# Patient Record
Sex: Female | Born: 1960 | Race: White | Hispanic: No | Smoking: Current every day smoker
Health system: Southern US, Community
[De-identification: ages and names within clinical notes are randomized; demographics above are authoritative.]

## PROBLEM LIST (undated history)

## (undated) DIAGNOSIS — F419 Anxiety disorder, unspecified: Secondary | ICD-10-CM

## (undated) DIAGNOSIS — R945 Abnormal results of liver function studies: Secondary | ICD-10-CM

## (undated) DIAGNOSIS — R7989 Other specified abnormal findings of blood chemistry: Secondary | ICD-10-CM

## (undated) DIAGNOSIS — G4700 Insomnia, unspecified: Secondary | ICD-10-CM

## (undated) DIAGNOSIS — I10 Essential (primary) hypertension: Secondary | ICD-10-CM

## (undated) DIAGNOSIS — E785 Hyperlipidemia, unspecified: Secondary | ICD-10-CM

## (undated) DIAGNOSIS — E039 Hypothyroidism, unspecified: Secondary | ICD-10-CM

## (undated) DIAGNOSIS — R0683 Snoring: Principal | ICD-10-CM

## (undated) DIAGNOSIS — F17200 Nicotine dependence, unspecified, uncomplicated: Secondary | ICD-10-CM

## (undated) HISTORY — DX: Snoring: R06.83

## (undated) HISTORY — DX: Hyperlipidemia, unspecified: E78.5

## (undated) HISTORY — DX: Abnormal results of liver function studies: R94.5

## (undated) HISTORY — DX: Other specified abnormal findings of blood chemistry: R79.89

## (undated) HISTORY — DX: Anxiety disorder, unspecified: F41.9

## (undated) HISTORY — DX: Insomnia, unspecified: G47.00

## (undated) HISTORY — DX: Hypothyroidism, unspecified: E03.9

## (undated) HISTORY — DX: Essential (primary) hypertension: I10

## (undated) HISTORY — DX: Nicotine dependence, unspecified, uncomplicated: F17.200

---

## 1998-12-09 HISTORY — PX: ABDOMINAL HYSTERECTOMY: SHX81

## 2004-01-12 ENCOUNTER — Other Ambulatory Visit: Payer: Self-pay

## 2005-08-05 ENCOUNTER — Ambulatory Visit: Payer: Self-pay | Admitting: Unknown Physician Specialty

## 2006-05-16 ENCOUNTER — Ambulatory Visit: Payer: Self-pay | Admitting: Family Medicine

## 2008-12-09 HISTORY — PX: COLONOSCOPY W/ POLYPECTOMY: SHX1380

## 2009-01-19 ENCOUNTER — Ambulatory Visit: Payer: Self-pay | Admitting: Family Medicine

## 2009-02-08 ENCOUNTER — Ambulatory Visit: Payer: Self-pay | Admitting: Family Medicine

## 2009-08-16 ENCOUNTER — Ambulatory Visit: Payer: Self-pay | Admitting: Family Medicine

## 2009-08-23 ENCOUNTER — Ambulatory Visit: Payer: Self-pay | Admitting: Family Medicine

## 2010-04-12 ENCOUNTER — Ambulatory Visit: Payer: Self-pay | Admitting: Gastroenterology

## 2010-10-22 ENCOUNTER — Ambulatory Visit: Payer: Self-pay | Admitting: Unknown Physician Specialty

## 2010-10-24 ENCOUNTER — Ambulatory Visit: Payer: Self-pay | Admitting: Unknown Physician Specialty

## 2012-09-16 ENCOUNTER — Other Ambulatory Visit: Payer: Self-pay | Admitting: *Deleted

## 2012-09-16 MED ORDER — LEVOTHYROXINE SODIUM 100 MCG PO TABS: 100.0000 ug | ORAL_TABLET | Freq: Every day | ORAL | Status: DC

## 2012-09-22 ENCOUNTER — Ambulatory Visit (INDEPENDENT_AMBULATORY_CARE_PROVIDER_SITE_OTHER): Payer: 59 | Admitting: Family Medicine

## 2012-09-22 ENCOUNTER — Telehealth: Payer: Self-pay

## 2012-09-22 ENCOUNTER — Ambulatory Visit: Payer: 59

## 2012-09-22 ENCOUNTER — Encounter: Payer: Self-pay | Admitting: Family Medicine

## 2012-09-22 ENCOUNTER — Other Ambulatory Visit: Payer: Self-pay | Admitting: Family Medicine

## 2012-09-22 VITALS — BP 140/106 | HR 98 | Temp 98.0°F | Resp 16 | Ht 64.5 in | Wt 247.8 lb

## 2012-09-22 DIAGNOSIS — M545 Low back pain, unspecified: Secondary | ICD-10-CM

## 2012-09-22 DIAGNOSIS — E039 Hypothyroidism, unspecified: Secondary | ICD-10-CM

## 2012-09-22 DIAGNOSIS — R319 Hematuria, unspecified: Secondary | ICD-10-CM

## 2012-09-22 DIAGNOSIS — E7800 Pure hypercholesterolemia, unspecified: Secondary | ICD-10-CM

## 2012-09-22 MED ORDER — MELOXICAM 15 MG PO TABS: 15.0000 mg | ORAL_TABLET | Freq: Every day | ORAL | Status: DC

## 2012-09-22 MED ORDER — LOVASTATIN 20 MG PO TABS: 20.0000 mg | ORAL_TABLET | Freq: Every day | ORAL | Status: DC

## 2012-09-22 NOTE — Telephone Encounter (Signed)
Dr. Smith please advise

## 2012-09-22 NOTE — Patient Instructions (Addendum)
1. Unspecified hypothyroidism  TSH [[facility]], T4, free [[facility]]  2. Pure hypercholesterolemia  CBC [[facility]], Comprehensive metabolic panel [[facility]], lovastatin (MEVACOR) 20 MG tablet  3. Low back pain  DG Lumbar Spine Complete, meloxicam (MOBIC) 15 MG tablet   Low Back Sprain with Rehab  A sprain is an injury in which a ligament is torn. The ligaments of the lower back are vulnerable to sprains. However, they are strong and require great force to be injured. These ligaments are important for stabilizing the spinal column. Sprains are classified into three categories. Grade 1 sprains cause pain, but the tendon is not lengthened. Grade 2 sprains include a lengthened ligament, due to the ligament being stretched or partially ruptured. With grade 2 sprains there is still function, although the function may be decreased. Grade 3 sprains involve a complete tear of the tendon or muscle, and function is usually impaired. SYMPTOMS   Severe pain in the lower back.  Sometimes, a feeling of a "pop," "snap," or tear, at the time of injury.  Tenderness and sometimes swelling at the injury site.  Uncommonly, bruising (contusion) within 48 hours of injury.  Muscle spasms in the back. CAUSES  Low back sprains occur when a force is placed on the ligaments that is greater than they can handle. Common causes of injury include:  Performing a stressful act while off-balance.  Repetitive stressful activities that involve movement of the lower back.  Direct hit (trauma) to the lower back. RISK INCREASES WITH:  Contact sports (football, wrestling).  Collisions (major skiing accidents).  Sports that require throwing or lifting (baseball, weightlifting).  Sports involving twisting of the spine (gymnastics, diving, tennis, golf).  Poor strength and flexibility.  Inadequate protection.  Previous back injury or surgery (especially fusion). PREVENTION  Wear properly fitted and padded protective  equipment.  Warm up and stretch properly before activity.  Allow for adequate recovery between workouts.  Maintain physical fitness:  Strength, flexibility, and endurance.  Cardiovascular fitness.  Maintain a healthy body weight. PROGNOSIS  If treated properly, low back sprains usually heal with non-surgical treatment. The length of time for healing depends on the severity of the injury.  RELATED COMPLICATIONS   Recurring symptoms, resulting in a chronic problem.  Chronic inflammation and pain in the low back.  Delayed healing or resolution of symptoms, especially if activity is resumed too soon.  Prolonged impairment.  Unstable or arthritic joints of the low back. TREATMENT  Treatment first involves the use of ice and medicine, to reduce pain and inflammation. The use of strengthening and stretching exercises may help reduce pain with activity. These exercises may be performed at home or with a therapist. Severe injuries may require referral to a therapist for further evaluation and treatment, such as ultrasound. Your caregiver may advise that you wear a back brace or corset, to help reduce pain and discomfort. Often, prolonged bed rest results in greater harm then benefit. Corticosteroid injections may be recommended. However, these should be reserved for the most serious cases. It is important to avoid using your back when lifting objects. At night, sleep on your back on a firm mattress, with a pillow placed under your knees. If non-surgical treatment is unsuccessful, surgery may be needed.  MEDICATION   If pain medicine is needed, nonsteroidal anti-inflammatory medicines (aspirin and ibuprofen), or other minor pain relievers (acetaminophen), are often advised.  Do not take pain medicine for 7 days before surgery.  Prescription pain relievers may be given, if your caregiver thinks  they are needed. Use only as directed and only as much as you need.  Ointments applied to the skin  may be helpful.  Corticosteroid injections may be given by your caregiver. These injections should be reserved for the most serious cases, because they may only be given a certain number of times. HEAT AND COLD  Cold treatment (icing) should be applied for 10 to 15 minutes every 2 to 3 hours for inflammation and pain, and immediately after activity that aggravates your symptoms. Use ice packs or an ice massage.  Heat treatment may be used before performing stretching and strengthening activities prescribed by your caregiver, physical therapist, or athletic trainer. Use a heat pack or a warm water soak. SEEK MEDICAL CARE IF:   Symptoms get worse or do not improve in 2 to 4 weeks, despite treatment.  You develop numbness or weakness in either leg.  You lose bowel or bladder function.  Any of the following occur after surgery: fever, increased pain, swelling, redness, drainage of fluids, or bleeding in the affected area.  New, unexplained symptoms develop. (Drugs used in treatment may produce side effects.) EXERCISES  RANGE OF MOTION (ROM) AND STRETCHING EXERCISES - Low Back Sprain Most people with lower back pain will find that their symptoms get worse with excessive bending forward (flexion) or arching at the lower back (extension). The exercises that will help resolve your symptoms will focus on the opposite motion.  Your physician, physical therapist or athletic trainer will help you determine which exercises will be most helpful to resolve your lower back pain. Do not complete any exercises without first consulting with your caregiver. Discontinue any exercises which make your symptoms worse, until you speak to your caregiver. If you have pain, numbness or tingling which travels down into your buttocks, leg or foot, the goal of the therapy is for these symptoms to move closer to your back and eventually resolve. Sometimes, these leg symptoms will get better, but your lower back pain may  worsen. This is often an indication of progress in your rehabilitation. Be very alert to any changes in your symptoms and the activities in which you participated in the 24 hours prior to the change. Sharing this information with your caregiver will allow him or her to most efficiently treat your condition. These exercises may help you when beginning to rehabilitate your injury. Your symptoms may resolve with or without further involvement from your physician, physical therapist or athletic trainer. While completing these exercises, remember:   Restoring tissue flexibility helps normal motion to return to the joints. This allows healthier, less painful movement and activity.  An effective stretch should be held for at least 30 seconds.  A stretch should never be painful. You should only feel a gentle lengthening or release in the stretched tissue. FLEXION RANGE OF MOTION AND STRETCHING EXERCISES: STRETCH  Flexion, Single Knee to Chest   Lie on a firm bed or floor with both legs extended in front of you.  Keeping one leg in contact with the floor, bring your opposite knee to your chest. Hold your leg in place by either grabbing behind your thigh or at your knee.  Pull until you feel a gentle stretch in your low back. Hold __________ seconds.  Slowly release your grasp and repeat the exercise with the opposite side. Repeat __________ times. Complete this exercise __________ times per day.  STRETCH  Flexion, Double Knee to Chest  Lie on a firm bed or floor with both legs  extended in front of you.  Keeping one leg in contact with the floor, bring your opposite knee to your chest.  Tense your stomach muscles to support your back and then lift your other knee to your chest. Hold your legs in place by either grabbing behind your thighs or at your knees.  Pull both knees toward your chest until you feel a gentle stretch in your low back. Hold __________ seconds.  Tense your stomach muscles and  slowly return one leg at a time to the floor. Repeat __________ times. Complete this exercise __________ times per day.  STRETCH  Low Trunk Rotation  Lie on a firm bed or floor. Keeping your legs in front of you, bend your knees so they are both pointed toward the ceiling and your feet are flat on the floor.  Extend your arms out to the side. This will stabilize your upper body by keeping your shoulders in contact with the floor.  Gently and slowly drop both knees together to one side until you feel a gentle stretch in your low back. Hold for __________ seconds.  Tense your stomach muscles to support your lower back as you bring your knees back to the starting position. Repeat the exercise to the other side. Repeat __________ times. Complete this exercise __________ times per day  EXTENSION RANGE OF MOTION AND FLEXIBILITY EXERCISES: STRETCH  Extension, Prone on Elbows   Lie on your stomach on the floor, a bed will be too soft. Place your palms about shoulder width apart and at the height of your head.  Place your elbows under your shoulders. If this is too painful, stack pillows under your chest.  Allow your body to relax so that your hips drop lower and make contact more completely with the floor.  Hold this position for __________ seconds.  Slowly return to lying flat on the floor. Repeat __________ times. Complete this exercise __________ times per day.  RANGE OF MOTION  Extension, Prone Press Ups  Lie on your stomach on the floor, a bed will be too soft. Place your palms about shoulder width apart and at the height of your head.  Keeping your back as relaxed as possible, slowly straighten your elbows while keeping your hips on the floor. You may adjust the placement of your hands to maximize your comfort. As you gain motion, your hands will come more underneath your shoulders.  Hold this position __________ seconds.  Slowly return to lying flat on the floor. Repeat __________  times. Complete this exercise __________ times per day.  RANGE OF MOTION- Quadruped, Neutral Spine   Assume a hands and knees position on a firm surface. Keep your hands under your shoulders and your knees under your hips. You may place padding under your knees for comfort.  Drop your head and point your tailbone toward the ground below you. This will round out your lower back like an angry cat. Hold this position for __________ seconds.  Slowly lift your head and release your tail bone so that your back sags into a large arch, like an old horse.  Hold this position for __________ seconds.  Repeat this until you feel limber in your low back.  Now, find your "sweet spot." This will be the most comfortable position somewhere between the two previous positions. This is your neutral spine. Once you have found this position, tense your stomach muscles to support your low back.  Hold this position for __________ seconds. Repeat __________ times. Complete this  exercise __________ times per day.  STRENGTHENING EXERCISES - Low Back Sprain These exercises may help you when beginning to rehabilitate your injury. These exercises should be done near your "sweet spot." This is the neutral, low-back arch, somewhere between fully rounded and fully arched, that is your least painful position. When performed in this safe range of motion, these exercises can be used for people who have either a flexion or extension based injury. These exercises may resolve your symptoms with or without further involvement from your physician, physical therapist or athletic trainer. While completing these exercises, remember:   Muscles can gain both the endurance and the strength needed for everyday activities through controlled exercises.  Complete these exercises as instructed by your physician, physical therapist or athletic trainer. Increase the resistance and repetitions only as guided.  You may experience muscle soreness  or fatigue, but the pain or discomfort you are trying to eliminate should never worsen during these exercises. If this pain does worsen, stop and make certain you are following the directions exactly. If the pain is still present after adjustments, discontinue the exercise until you can discuss the trouble with your caregiver. STRENGTHENING Deep Abdominals, Pelvic Tilt   Lie on a firm bed or floor. Keeping your legs in front of you, bend your knees so they are both pointed toward the ceiling and your feet are flat on the floor.  Tense your lower abdominal muscles to press your low back into the floor. This motion will rotate your pelvis so that your tail bone is scooping upwards rather than pointing at your feet or into the floor. With a gentle tension and even breathing, hold this position for __________ seconds. Repeat __________ times. Complete this exercise __________ times per day.  STRENGTHENING  Abdominals, Crunches   Lie on a firm bed or floor. Keeping your legs in front of you, bend your knees so they are both pointed toward the ceiling and your feet are flat on the floor. Cross your arms over your chest.  Slightly tip your chin down without bending your neck.  Tense your abdominals and slowly lift your trunk high enough to just clear your shoulder blades. Lifting higher can put excessive stress on the lower back and does not further strengthen your abdominal muscles.  Control your return to the starting position. Repeat __________ times. Complete this exercise __________ times per day.  STRENGTHENING  Quadruped, Opposite UE/LE Lift   Assume a hands and knees position on a firm surface. Keep your hands under your shoulders and your knees under your hips. You may place padding under your knees for comfort.  Find your neutral spine and gently tense your abdominal muscles so that you can maintain this position. Your shoulders and hips should form a rectangle that is parallel with the  floor and is not twisted.  Keeping your trunk steady, lift your right hand no higher than your shoulder and then your left leg no higher than your hip. Make sure you are not holding your breath. Hold this position for __________ seconds.  Continuing to keep your abdominal muscles tense and your back steady, slowly return to your starting position. Repeat with the opposite arm and leg. Repeat __________ times. Complete this exercise __________ times per day.  STRENGTHENING  Abdominals and Quadriceps, Straight Leg Raise   Lie on a firm bed or floor with both legs extended in front of you.  Keeping one leg in contact with the floor, bend the other knee so that  your foot can rest flat on the floor.  Find your neutral spine, and tense your abdominal muscles to maintain your spinal position throughout the exercise.  Slowly lift your straight leg off the floor about 6 inches for a count of 15, making sure to not hold your breath.  Still keeping your neutral spine, slowly lower your leg all the way to the floor. Repeat this exercise with each leg __________ times. Complete this exercise __________ times per day. POSTURE AND BODY MECHANICS CONSIDERATIONS - Low Back Sprain Keeping correct posture when sitting, standing or completing your activities will reduce the stress put on different body tissues, allowing injured tissues a chance to heal and limiting painful experiences. The following are general guidelines for improved posture. Your physician or physical therapist will provide you with any instructions specific to your needs. While reading these guidelines, remember:  The exercises prescribed by your provider will help you have the flexibility and strength to maintain correct postures.  The correct posture provides the best environment for your joints to work. All of your joints have less wear and tear when properly supported by a spine with good posture. This means you will experience a  healthier, less painful body.  Correct posture must be practiced with all of your activities, especially prolonged sitting and standing. Correct posture is as important when doing repetitive low-stress activities (typing) as it is when doing a single heavy-load activity (lifting). RESTING POSITIONS Consider which positions are most painful for you when choosing a resting position. If you have pain with flexion-based activities (sitting, bending, stooping, squatting), choose a position that allows you to rest in a less flexed posture. You would want to avoid curling into a fetal position on your side. If your pain worsens with extension-based activities (prolonged standing, working overhead), avoid resting in an extended position such as sleeping on your stomach. Most people will find more comfort when they rest with their spine in a more neutral position, neither too rounded nor too arched. Lying on a non-sagging bed on your side with a pillow between your knees, or on your back with a pillow under your knees will often provide some relief. Keep in mind, being in any one position for a prolonged period of time, no matter how correct your posture, can still lead to stiffness. PROPER SITTING POSTURE In order to minimize stress and discomfort on your spine, you must sit with correct posture. Sitting with good posture should be effortless for a healthy body. Returning to good posture is a gradual process. Many people can work toward this most comfortably by using various supports until they have the flexibility and strength to maintain this posture on their own. When sitting with proper posture, your ears will fall over your shoulders and your shoulders will fall over your hips. You should use the back of the chair to support your upper back. Your lower back will be in a neutral position, just slightly arched. You may place a small pillow or folded towel at the base of your lower back for  support.  When  working at a desk, create an environment that supports good, upright posture. Without extra support, muscles tire, which leads to excessive strain on joints and other tissues. Keep these recommendations in mind: CHAIR:  A chair should be able to slide under your desk when your back makes contact with the back of the chair. This allows you to work closely.  The chair's height should allow your eyes to be  level with the upper part of your monitor and your hands to be slightly lower than your elbows. BODY POSITION  Your feet should make contact with the floor. If this is not possible, use a foot rest.  Keep your ears over your shoulders. This will reduce stress on your neck and low back. INCORRECT SITTING POSTURES  If you are feeling tired and unable to assume a healthy sitting posture, do not slouch or slump. This puts excessive strain on your back tissues, causing more damage and pain. Healthier options include:  Using more support, like a lumbar pillow.  Switching tasks to something that requires you to be upright or walking.  Talking a brief walk.  Lying down to rest in a neutral-spine position. PROLONGED STANDING WHILE SLIGHTLY LEANING FORWARD  When completing a task that requires you to lean forward while standing in one place for a long time, place either foot up on a stationary 2-4 inch high object to help maintain the best posture. When both feet are on the ground, the lower back tends to lose its slight inward curve. If this curve flattens (or becomes too large), then the back and your other joints will experience too much stress, tire more quickly, and can cause pain. CORRECT STANDING POSTURES Proper standing posture should be assumed with all daily activities, even if they only take a few moments, like when brushing your teeth. As in sitting, your ears should fall over your shoulders and your shoulders should fall over your hips. You should keep a slight tension in your abdominal  muscles to brace your spine. Your tailbone should point down to the ground, not behind your body, resulting in an over-extended swayback posture.  INCORRECT STANDING POSTURES  Common incorrect standing postures include a forward head, locked knees and/or an excessive swayback. WALKING Walk with an upright posture. Your ears, shoulders and hips should all line-up. PROLONGED ACTIVITY IN A FLEXED POSITION When completing a task that requires you to bend forward at your waist or lean over a low surface, try to find a way to stabilize 3 out of 4 of your limbs. You can place a hand or elbow on your thigh or rest a knee on the surface you are reaching across. This will provide you more stability, so that your muscles do not tire as quickly. By keeping your knees relaxed, or slightly bent, you will also reduce stress across your lower back. CORRECT LIFTING TECHNIQUES DO :  Assume a wide stance. This will provide you more stability and the opportunity to get as close as possible to the object which you are lifting.  Tense your abdominals to brace your spine. Bend at the knees and hips. Keeping your back locked in a neutral-spine position, lift using your leg muscles. Lift with your legs, keeping your back straight.  Test the weight of unknown objects before attempting to lift them.  Try to keep your elbows locked down at your sides in order get the best strength from your shoulders when carrying an object.  Always ask for help when lifting heavy or awkward objects. INCORRECT LIFTING TECHNIQUES DO NOT:   Lock your knees when lifting, even if it is a small object.  Bend and twist. Pivot at your feet or move your feet when needing to change directions.  Assume that you can safely pick up even a paperclip without proper posture. Document Released: 11/25/2005 Document Revised: 02/17/2012 Document Reviewed: 03/09/2009 [facility] Patient Information 2013 [place], [place].

## 2012-09-22 NOTE — Progress Notes (Signed)
[address]   [place], [place]  [address]   [phone]  Subjective:    Patient ID: Beth Hughes, female    DOB: [birth year], 51 y.o.   MRN: [id number]  HPIThis 50 y.o. female presents to establish care and for six month follow-up:  1.  Fatigue: tired a lot.  Onset two weeks ago but intermittent since killing thyroid.  S/p thyroid ablation.  Chronic stress at work, home, family.  No over the top stress; work is crazy.  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    2.  Hypercholesterolemia:  Fasting.  Out of medication in one week.  Due for labs.  Report moderate compliance with medication; good tolerance to medication; good symptom control. Denies HA/dizziness/focal weakness/paresthesias.  3.  R hip pain: onset several months ago.  Sitting too long causes lower R hip.  Bought new bed.  No radiation into leg.  +Lower back pain.  +numbness in toes with prolonged sitting.  No weakness in leg.  No b/b function.  No saddle paresthesias.  Taking nothing really.  Severity 2-8/10.  By end of day, miserable.    4. Flu shot: getting at work.   Review of Systems  Constitutional: Positive for fatigue. Negative for fever, chills, diaphoresis, activity change, appetite change and unexpected weight change.  Eyes: Negative for photophobia and visual disturbance.  Respiratory: Negative for cough, shortness of breath and wheezing.   Cardiovascular: Negative for chest pain, palpitations and leg swelling.  Musculoskeletal: Positive for myalgias, back pain, arthralgias and gait problem. Negative for joint swelling.  Neurological: Positive for numbness. Negative for dizziness, tremors, syncope, facial asymmetry, speech difficulty, weakness, light-headedness and headaches.  Psychiatric/Behavioral: Positive for dysphoric mood. Negative for suicidal ideas and self-injury. The patient is nervous/anxious.     Past Medical History  Diagnosis Date  . Unspecified hypothyroidism   . Other and unspecified hyperlipidemia   .  Insomnia, unspecified   . Tobacco use disorder   . Anxiety   . Abnormal LFTs (liver function tests)     No past surgical history on file.  Prior to Admission medications   Medication Sig Start Date End Date Taking? Authorizing Provider  ALPRAZolam (XANAX) 0.25 MG tablet Take 1 tablet (0.25 mg total) by mouth at bedtime as needed. 09/23/12  Yes Ethelda Chick, MD  cyclobenzaprine (FLEXERIL) 10 MG tablet Take 1 tablet (10 mg total) by mouth 3 (three) times daily as needed. 09/23/12  Yes Ethelda Chick, MD  levothyroxine (SYNTHROID, LEVOTHROID) 100 MCG tablet Take 1 tablet (100 mcg total) by mouth daily. 09/16/12  Yes Heather M Marte, PA-C  lovastatin (MEVACOR) 20 MG tablet Take 1 tablet (20 mg total) by mouth at bedtime. 09/22/12  Yes Ethelda Chick, MD  meloxicam (MOBIC) 15 MG tablet Take 1 tablet (15 mg total) by mouth daily. 09/22/12   Ethelda Chick, MD    Allergies  Allergen Reactions  . Penicillins     Blacked out  . Pseudoephedrine     Heart races, seizures    History   Social History  . Marital Status: Unknown    Spouse Name: N/A    Number of Children: N/A  . Years of Education: N/A   Occupational History  . Not on file.   Social History Main Topics  . Smoking status: Current Every Day Smoker  . Smokeless tobacco: Not on file  . Alcohol Use: No  . Drug Use: No  . Sexually Active: Not on file   Other  Topics Concern  . Not on file   Social History Narrative  . No narrative on file    No family history on file.      Objective:   Physical Exam  Nursing note and vitals reviewed. Constitutional: She is oriented to person, place, and time. She appears well-developed and well-nourished. No distress.  HENT:  Mouth/Throat: Oropharynx is clear and moist.  Eyes: Conjunctivae normal and EOM are normal. Pupils are equal, round, and reactive to light.  Neck: Normal range of motion. Neck supple. No JVD present. No thyromegaly present.  Cardiovascular: Normal rate,  regular rhythm, normal heart sounds and intact distal pulses.  Exam reveals no gallop and no friction rub.   No murmur heard. Pulmonary/Chest: Effort normal and breath sounds normal. She has no wheezes. She has no rales.  Abdominal: Soft. Bowel sounds are normal. There is no tenderness. There is no rebound and no guarding.  Musculoskeletal:       Right hip: She exhibits normal range of motion, normal strength and no tenderness.       Left hip: She exhibits normal range of motion, normal strength and no tenderness.       Lumbar back: She exhibits decreased range of motion, tenderness, pain and spasm. She exhibits no bony tenderness.       LUMBAR:  +TTP PARASPINAL REGIONS; STRAIGHT LEG RAISES NEGATIVE; MOTOR 5/5 BLE; TOE AND HEEL WALKING INTACT.  MARCHING INTACT.   B HIPS: FULL ROM B HIPS; NON-TENDER ALONG PELVIS; NORMAL INTERNAL AND EXTERNAL ROTATION; MOTOR 5/5 ALL DIRECTIONS OF HIPS.  Lymphadenopathy:    She has no cervical adenopathy.  Neurological: She is alert and oriented to person, place, and time. She has normal reflexes. No cranial nerve deficit. She exhibits normal muscle tone. Coordination normal.  Skin: No rash noted. She is not diaphoretic.  Psychiatric: She has a normal mood and affect. Her behavior is normal. Judgment and thought content normal.    [facility] reading (PRIMARY) by  Dr. Katrinka Blazing.  LS spine: NAD.  Results for orders placed in visit on 09/22/12  URINE CULTURE      Component Value Range   Colony Count NO GROWTH     Organism ID, Bacteria NO GROWTH         Assessment & Plan:   1. Unspecified hypothyroidism  TSH [[facility]], T4, free [[facility]]  2. Pure hypercholesterolemia  CBC [[facility]], Comprehensive metabolic panel [[facility]], lovastatin (MEVACOR) 20 MG tablet  3. Low back pain  DG Lumbar Spine Complete, meloxicam (MOBIC) 15 MG tablet  4. Hematuria  Urine culture     1.  Hypothyroidism:  Controlled; obtain labs; continue current medication. 2.  Hypercholesterolemia:   Moderately controlled; obtain labs; refill of Lovastatin provided. 3.  Low back pain:  New.  Rx for Mobic 15mg  daily; start Flexeril tid PRN but scheduled qhs.  Home exercises recommended to perform daily.  If no improvement in 4-6 weeks, to call for ortho referral. 4. Hematuria:  New. Send urine culture; if negative, will warrant repeat u/a at next visit.  If blood remains, will warrant urology referral.  Meds ordered this encounter  Medications  . DISCONTD: ALPRAZolam (XANAX) 0.25 MG tablet    Sig: Take 0.25 mg by mouth at bedtime as needed.  Marland Kitchen DISCONTD: cyclobenzaprine (FLEXERIL) 10 MG tablet    Sig: Take 10 mg by mouth 3 (three) times daily as needed.  Marland Kitchen DISCONTD: lovastatin (MEVACOR) 20 MG tablet    Sig: Take 20 mg by mouth at  bedtime.  . meloxicam (MOBIC) 15 MG tablet    Sig: Take 1 tablet (15 mg total) by mouth daily.    Dispense:  30 tablet    Refill:  0  . lovastatin (MEVACOR) 20 MG tablet    Sig: Take 1 tablet (20 mg total) by mouth at bedtime.    Dispense:  30 tablet    Refill:  11  . cyclobenzaprine (FLEXERIL) 10 MG tablet    Sig: Take 1 tablet (10 mg total) by mouth 3 (three) times daily as needed.    Dispense:  60 tablet    Refill:  3  . ALPRAZolam (XANAX) 0.25 MG tablet    Sig: Take 1 tablet (0.25 mg total) by mouth at bedtime as needed.    Dispense:  30 tablet    Refill:  3

## 2012-09-22 NOTE — Telephone Encounter (Signed)
Pt states that she saw dr Katrinka Blazing this morning and that she was supposed to call in a muscle relaxer and xanax but the pharmacy did not get those   Best [phone]

## 2012-09-23 MED ORDER — ALPRAZOLAM 0.25 MG PO TABS: 0.2500 mg | ORAL_TABLET | Freq: Every evening | ORAL | Status: DC | PRN

## 2012-09-23 MED ORDER — CYCLOBENZAPRINE HCL 10 MG PO TABS
10.0000 mg | ORAL_TABLET | Freq: Three times a day (TID) | ORAL | Status: DC | PRN
Start: 2012-09-23 — End: 2013-04-20

## 2012-09-23 NOTE — Telephone Encounter (Signed)
I sent in Flexeril/muscle relaxer to pharmacy.  Please call in Xanax 0.25mg  one po qhs PRN #[phone] refills. KMS

## 2012-09-23 NOTE — Telephone Encounter (Signed)
Called patient to advise. This is done for her, called in the Xanax, others sent by Dr Katrinka Blazing.

## 2012-09-24 LAB — URINE CULTURE: Colony Count: NO GROWTH

## 2012-09-30 ENCOUNTER — Telehealth: Payer: Self-pay

## 2012-09-30 LAB — COMPREHENSIVE METABOLIC PANEL
ALT: 22 IU/L (ref 0–32)
AST: 20 IU/L (ref 0–40)
Albumin/Globulin Ratio: 1.4 (ref 1.1–2.5)
Alkaline Phosphatase: 78 IU/L (ref 42–107)
BUN/Creatinine Ratio: 17 (ref 9–23)
Chloride: 103 mmol/L (ref 97–108)
GFR calc Af Amer: 104 mL/min/{1.73_m2} (ref >59)
Glucose: 83 mg/dL (ref 65–99)
Potassium: 4.1 mmol/L (ref 3.5–5.2)
Sodium: 136 mmol/L (ref 134–144)
Total Bilirubin: 0.2 mg/dL (ref 0.0–1.2)

## 2012-09-30 LAB — CBC WITH DIFFERENTIAL
Basophils Absolute: 0 10*3/uL (ref 0.0–0.2)
Eosinophils Absolute: 0.1 10*3/uL (ref 0.0–0.4)
HCT: 47.6 % — ABNORMAL HIGH (ref 34.0–46.6)
Lymphs: 38 % (ref 14–46)
MCH: 29.9 pg (ref 26.6–33.0)
MCHC: 33.6 g/dL (ref 31.5–35.7)
Monocytes Absolute: 0.3 10*3/uL (ref 0.1–0.9)
Neutrophils Absolute: 3.3 10*3/uL (ref 1.4–7.0)
RDW: 13.5 % (ref 12.3–15.4)

## 2012-09-30 LAB — TSH+FREE T4
Free T4: 1.36 ng/dL (ref 0.82–1.77)
TSH: 4.11 u[IU]/mL (ref 0.450–4.500)

## 2012-09-30 LAB — SPECIMEN STATUS REPORT

## 2012-09-30 NOTE — Telephone Encounter (Signed)
Patient was called today concerning xray results.  Patient inquired about labs.  Called [facility] for results and spoke with Lequita Halt, she will fax results.  Results given to Dr. Nilda Simmer for review.

## 2012-10-01 NOTE — Telephone Encounter (Signed)
Lab results received from [employer] on 10/23; pt notified of results on 10/01/12.  Apologized in delay of advising of results. KMS

## 2012-10-05 ENCOUNTER — Encounter: Payer: Self-pay | Admitting: *Deleted

## 2012-10-05 ENCOUNTER — Encounter: Payer: Self-pay | Admitting: Family Medicine

## 2012-12-04 NOTE — Progress Notes (Signed)
Reviewed and agree.

## 2012-12-22 ENCOUNTER — Encounter: Payer: Self-pay | Admitting: *Deleted

## 2012-12-22 ENCOUNTER — Telehealth: Payer: Self-pay | Admitting: *Deleted

## 2012-12-22 NOTE — Telephone Encounter (Signed)
Per Dr. Katrinka Blazing let pt know that cholesterol under excellent control and thyroid function is normal at 2.500.  Lm with woman to cb

## 2012-12-24 MED ORDER — LEVOTHYROXINE SODIUM 100 MCG PO TABS: ORAL_TABLET | ORAL | Status: DC

## 2012-12-24 MED ORDER — LEVOTHYROXINE SODIUM 100 MCG PO TABS: 100.0000 ug | ORAL_TABLET | Freq: Every day | ORAL | Status: DC

## 2012-12-24 NOTE — Telephone Encounter (Signed)
Gave pt lab results and advised pt I am sending her in a new Rx for her levothyrozine. Pt wanted to make sure that we send in enough for the new dose she had been taking for the month previous to test which was 1 tab QD except 1 1/2 tabs on Tues and Thurs. Checked w/Dr Katrinka Blazing and she agreed that we need to send in the new Rx w/new sig and enough tablets to last for the month at new dose. Sent in Rx.

## 2013-01-12 ENCOUNTER — Telehealth: Payer: Self-pay

## 2013-01-12 NOTE — Telephone Encounter (Signed)
Pt is requesting to speak with dr Katrinka Blazing regarding services on 09/22/12. She states she is getting a bill from [place] for a urine culture and states she did not give a urine specimen and there was not anything sent out for a culture.

## 2013-01-12 NOTE — Telephone Encounter (Signed)
Dr. Katrinka Blazing, I see you did a urine culture on pt for hematuria, but pt doesn't recall peeing in a cup and we're not sure what to tell pt. Please advise.

## 2013-01-12 NOTE — Telephone Encounter (Signed)
Spoke to patient she states she did not provide urine specimen. Please advise.

## 2013-01-18 NOTE — Telephone Encounter (Signed)
After reviewing chart, it does look like the urine culture charge was an error on [facility].  Please apologize to patient for error.  We will correct [facility] bill.

## 2013-01-18 NOTE — Telephone Encounter (Signed)
Pt notified that we will doctor bill the lab

## 2013-02-22 ENCOUNTER — Telehealth: Payer: Self-pay | Admitting: Radiology

## 2013-02-22 NOTE — Telephone Encounter (Signed)
Different brand of Synthroid will be disensed to patient per [facility]. To you FYI

## 2013-02-22 NOTE — Telephone Encounter (Signed)
FYI only Dr. Katrinka Blazing

## 2013-03-08 ENCOUNTER — Telehealth: Payer: Self-pay

## 2013-03-08 MED ORDER — ALPRAZOLAM 0.25 MG PO TABS: 0.2500 mg | ORAL_TABLET | Freq: Every evening | ORAL | Status: DC | PRN
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
## 2013-03-08 NOTE — Telephone Encounter (Signed)
PATIENT STATES SHE IS GOING THRU A "BAD TIME" AND WOULD LIKE TO BE PRESCRIBED SOMETHING TO HELP COPE. TOLD PATIENT SHE PROBABLY NEEDS TO COME IN FOR AN OFFICE VISIT.   CALL BACK: [phone]

## 2013-03-08 NOTE — Telephone Encounter (Signed)
I am happy to refill Alprazolam since it is a refill and not a new rx.  She will be due for six month follow-up in 03/2013; thus, needs to schedule follow-up if does not have one.  If Alprazolam is not helping with stress, needs to let me know.

## 2013-03-08 NOTE — Telephone Encounter (Signed)
Patient advised rx called in for her to [facility]

## 2013-04-20 ENCOUNTER — Encounter: Payer: Self-pay | Admitting: Family Medicine

## 2013-04-20 ENCOUNTER — Ambulatory Visit (INDEPENDENT_AMBULATORY_CARE_PROVIDER_SITE_OTHER): Payer: 59 | Admitting: Family Medicine

## 2013-04-20 VITALS — BP 142/96 | HR 95 | Temp 98.0°F | Resp 16 | Ht 64.5 in | Wt 249.0 lb

## 2013-04-20 DIAGNOSIS — E7800 Pure hypercholesterolemia, unspecified: Secondary | ICD-10-CM

## 2013-04-20 DIAGNOSIS — M5030 Other cervical disc degeneration, unspecified cervical region: Secondary | ICD-10-CM

## 2013-04-20 DIAGNOSIS — S39012D Strain of muscle, fascia and tendon of lower back, subsequent encounter: Secondary | ICD-10-CM

## 2013-04-20 DIAGNOSIS — E039 Hypothyroidism, unspecified: Secondary | ICD-10-CM

## 2013-04-20 DIAGNOSIS — F411 Generalized anxiety disorder: Secondary | ICD-10-CM

## 2013-04-20 DIAGNOSIS — Z1239 Encounter for other screening for malignant neoplasm of breast: Secondary | ICD-10-CM

## 2013-04-20 MED ORDER — ALPRAZOLAM 0.25 MG PO TABS: 0.2500 mg | ORAL_TABLET | Freq: Every evening | ORAL | Status: DC | PRN

## 2013-04-20 MED ORDER — LEVOTHYROXINE SODIUM 100 MCG PO TABS: ORAL_TABLET | ORAL | Status: DC

## 2013-04-20 MED ORDER — CYCLOBENZAPRINE HCL 10 MG PO TABS: 10.0000 mg | ORAL_TABLET | Freq: Three times a day (TID) | ORAL | Status: DC | PRN
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
## 2013-05-20 ENCOUNTER — Other Ambulatory Visit: Payer: Self-pay | Admitting: *Deleted

## 2013-05-20 ENCOUNTER — Telehealth: Payer: Self-pay | Admitting: *Deleted

## 2013-05-20 DIAGNOSIS — Z1231 Encounter for screening mammogram for malignant neoplasm of breast: Secondary | ICD-10-CM

## 2013-05-20 DIAGNOSIS — E7800 Pure hypercholesterolemia, unspecified: Secondary | ICD-10-CM

## 2013-05-20 NOTE — Telephone Encounter (Signed)
Pt called and wanted to know her lab results.  Results are in your box in Dr lounge.

## 2013-05-21 MED ORDER — LOVASTATIN 40 MG PO TABS: 40.0000 mg | ORAL_TABLET | Freq: Every day | ORAL | Status: DC

## 2013-05-21 NOTE — Telephone Encounter (Signed)
Please return her call; here are her lab results:  1.  No evidence of anemia.  2.  Blood sugar normal at 80; no evidence of diabetes.  3. Liver and kidney functions normal.  4.  Thyroid function normal on current dose of medication.  5.  Vitamin B12 and folate levels normal.  6.  Vitamin D level low; recommend increasing daily Vitamin D supplementation by an additional 1000 IU; can purchase over the counter.  7.  Cholesterol above goal; recommend increasing Lovastatin to 40mg  daily; I have sent in new prescription to pharmacy.

## 2013-05-21 NOTE — Telephone Encounter (Signed)
Gave pt results and instr's. Pt agreed.

## 2013-05-25 ENCOUNTER — Ambulatory Visit: Payer: Self-pay | Admitting: Family Medicine

## 2013-06-02 ENCOUNTER — Encounter: Payer: Self-pay | Admitting: Radiology

## 2013-06-02 ENCOUNTER — Telehealth: Payer: Self-pay | Admitting: Radiology

## 2013-06-02 NOTE — Telephone Encounter (Signed)
Left voice mail mammogram normal

## 2013-06-03 ENCOUNTER — Encounter: Payer: Self-pay | Admitting: Family Medicine

## 2013-06-04 ENCOUNTER — Other Ambulatory Visit: Payer: Self-pay

## 2013-06-04 MED ORDER — LOVASTATIN 40 MG PO TABS: 40.0000 mg | ORAL_TABLET | Freq: Every day | ORAL | Status: DC

## 2013-07-29 ENCOUNTER — Other Ambulatory Visit: Payer: Self-pay

## 2013-07-29 MED ORDER — LOVASTATIN 40 MG PO TABS: 40.0000 mg | ORAL_TABLET | Freq: Every day | ORAL | Status: DC

## 2013-08-16 ENCOUNTER — Ambulatory Visit (INDEPENDENT_AMBULATORY_CARE_PROVIDER_SITE_OTHER): Payer: 59 | Admitting: Family Medicine

## 2013-08-16 ENCOUNTER — Encounter: Payer: Self-pay | Admitting: Family Medicine

## 2013-08-16 VITALS — BP 140/93 | HR 100 | Temp 98.2°F | Resp 18 | Ht 66.0 in | Wt 255.0 lb

## 2013-08-16 DIAGNOSIS — E890 Postprocedural hypothyroidism: Secondary | ICD-10-CM | POA: Insufficient documentation

## 2013-08-16 DIAGNOSIS — E7800 Pure hypercholesterolemia, unspecified: Secondary | ICD-10-CM | POA: Insufficient documentation

## 2013-08-16 DIAGNOSIS — F411 Generalized anxiety disorder: Secondary | ICD-10-CM | POA: Insufficient documentation

## 2013-08-16 DIAGNOSIS — Z01419 Encounter for gynecological examination (general) (routine) without abnormal findings: Secondary | ICD-10-CM | POA: Insufficient documentation

## 2013-08-16 DIAGNOSIS — Z0000 Encounter for general adult medical examination without abnormal findings: Secondary | ICD-10-CM

## 2013-08-16 DIAGNOSIS — F17200 Nicotine dependence, unspecified, uncomplicated: Secondary | ICD-10-CM

## 2013-08-16 DIAGNOSIS — Z720 Tobacco use: Secondary | ICD-10-CM | POA: Insufficient documentation

## 2013-08-16 DIAGNOSIS — R05 Cough: Secondary | ICD-10-CM | POA: Insufficient documentation

## 2013-08-16 DIAGNOSIS — R059 Cough, unspecified: Secondary | ICD-10-CM

## 2013-08-16 LAB — POCT UA - MICROSCOPIC ONLY
Crystals, Ur, HPF, POC: NEGATIVE
Mucus, UA: NEGATIVE
WBC, Ur, HPF, POC: NEGATIVE
Yeast, UA: NEGATIVE

## 2013-08-16 LAB — POCT URINALYSIS DIPSTICK
Bilirubin, UA: NEGATIVE
Ketones, UA: NEGATIVE
Leukocytes, UA: NEGATIVE
Spec Grav, UA: 1.01

## 2013-08-16 LAB — PULMONARY FUNCTION TEST

## 2013-08-16 MED ORDER — LEVOTHYROXINE SODIUM 100 MCG PO TABS: ORAL_TABLET | ORAL | Status: DC

## 2013-08-16 MED ORDER — ALPRAZOLAM 0.25 MG PO TABS: 0.2500 mg | ORAL_TABLET | Freq: Every evening | ORAL | Status: DC | PRN

## 2013-08-16 MED ORDER — CYCLOBENZAPRINE HCL 10 MG PO TABS: 10.0000 mg | ORAL_TABLET | Freq: Three times a day (TID) | ORAL | Status: DC | PRN

## 2013-08-16 NOTE — Progress Notes (Signed)
[address]   [place], [place]  [address]   [id number]  Subjective:    Patient ID: Beth Hughes, female    DOB: [birth year], 52 y.o.   MRN: [id number]  HPI This 52 y.o. female presents for evaluation for CPE.  Last physical 2012 Dr. Barnabas Lister. Pap smear 2012 Dr. Barnabas Lister. Mammogram 06/02/2013 normal. Colonoscopy 12/09/2008 Positive for polyps, needs repeat 2015.  Iftikhar. TDAP 04/23/2011. Pneumovax 04/23/2011. Flu vaccine gets annually at work. Eye exam- wears prescription glasses for working on computer. Sees [facility], last exam 2 years ago. 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  Mother passed away 5 weeks ago unexpectedly following CABG after 8 years of having bad health (CVA, brain anuryesm, DM) . Doesn't know history of father. Patient teary. Having good and bad days. Sleeping ok with xanax. Her sister had chemo, lumpectomy and is currently having radiation. Sister 11 with angina, edema. Brothers 2 deceased- liver cancer age 48, ETOH/asphixiation age 65. Living brother with CHF, COPD, DM age 35. Lives with son.  Quit smoking for over a month, started back when her mother died. Will try to stop in the future. No ETOH, drugs.  No regular exercise.  Will be having increased stress at work due to changing CPT codes.  Missed about 10 days of mevacor until able to get insurance straightened out.  BP was 128/80 at work recently. Has a  BP cuff at home.   Surgical history- had total hysterectomy 14 years ago due to ovarian cysts and bleeding.  Review of Systems  Constitutional: Negative.   HENT: Negative for neck pain and neck stiffness.   Eyes: Negative.   Respiratory: Positive for cough. Negative for shortness of breath, wheezing and stridor.   Cardiovascular: Negative for chest pain, palpitations and leg swelling.  Gastrointestinal: Negative.   Endocrine: Negative.   Genitourinary: Negative.   Musculoskeletal: Positive for back pain.  Skin: Negative.    Neurological: Negative.   Psychiatric/Behavioral: Positive for dysphoric mood. Negative for sleep disturbance. The patient is nervous/anxious.    Continued back and neck pain. Neck stiff. No shoulder pain. Pain with pressure on right shoulder. No numbness or tingling in arms or legs. Hearing ok. No headaches, no double vision. Some blurred vision at distance. No mouth sores. No chest pain, no palpitations. No shortness of breath. Occasional cough. Snores. Feels good in the mornings.  BM every day. No diarrhea. No heartburn. No stomach pain. No new night sweats. Stays hot all the time since hysterectomy 14 years ago. Nocturia x 2. Incontinence with coughing. Doesn't wear sunscreen except face. Never been to derm. No FH skin cancer.   Past Medical History  Diagnosis Date  . Unspecified hypothyroidism   . Other and unspecified hyperlipidemia   . Insomnia, unspecified   . Tobacco use disorder   . Anxiety   . Abnormal LFTs (liver function tests)     Past Surgical History  Procedure Laterality Date  . Abdominal hysterectomy  12/09/1998    ovarian cysts; B ooophorectomy  . Colonoscopy w/ polypectomy  12/09/2008    colon polyps. Iftikhar.  Repeat in 5 years.    Prior to Admission medications   Medication Sig Start Date End Date Taking? Authorizing Provider  ALPRAZolam (XANAX) 0.25 MG tablet Take 1 tablet (0.25 mg total) by mouth at bedtime as needed for anxiety. 08/16/13  Yes Ethelda Chick, MD  cyclobenzaprine (FLEXERIL) 10 MG tablet Take 1 tablet (10 mg total) by mouth 3 (three) times  daily as needed. 08/16/13  Yes Ethelda Chick, MD  levothyroxine (SYNTHROID, LEVOTHROID) 100 MCG tablet Take one tablet by mouth daily on every day, except on Tuesdays and Thursdays take 1 1/2 tablets. 08/16/13  Yes Ethelda Chick, MD  lovastatin (MEVACOR) 40 MG tablet Take 1 tablet (40 mg total) by mouth at bedtime. 07/29/13  Yes Ethelda Chick, MD  meloxicam (MOBIC) 15 MG tablet Take 1 tablet (15 mg total) by  mouth daily. 09/22/12  Yes Ethelda Chick, MD  zolpidem (AMBIEN) 10 MG tablet Take 10 mg by mouth at bedtime as needed for sleep.   Yes Historical Provider, MD    Allergies  Allergen Reactions  . Penicillins     Blacked out  . Pseudoephedrine     Heart races, seizures    History   Social History  . Marital Status: Unknown    Spouse Name: N/A    Number of Children: N/A  . Years of Education: N/A   Occupational History  . [employer] Production designer, theatre/television/film    Social History Main Topics  . Smoking status: Current Every Day Smoker -- 1.00 packs/day for 30 years  . Smokeless tobacco: Never Used  . Alcohol Use: No  . Drug Use: No  . Sexual Activity: Not Currently     Comment: Last sexual activity 2004.   Other Topics Concern  . Not on file   Social History Narrative   Marital status: single; not dating.  Not interested.      Children; one son (56); no grandchildren      Lives: with son.        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      Tobacco: 1 ppd x 35 years.      Alcohol: none      Drugs: none      Exercise: none    Family History  Problem Relation Age of Onset  . Congestive Heart Failure Mother   . Diabetes Mother   . COPD Mother   . Atrial fibrillation Mother   . Macular degeneration Mother   . Heart disease Mother 91    CABG  . Cancer Sister 38    breast cancer  . Alcohol abuse Brother   . COPD Brother   . Atrial fibrillation Brother        Objective:   Physical Exam  Nursing note and vitals reviewed. Constitutional: She is oriented to person, place, and time. She appears well-developed and well-nourished. No distress.  HENT:  Head: Normocephalic and atraumatic.  Right Ear: External ear normal.  Left Ear: External ear normal.  Nose: Nose normal.  Mouth/Throat: Oropharynx is clear and moist.  Eyes: Conjunctivae and EOM are normal. Pupils are equal, round, and reactive to light.  Neck: Normal range of motion. Neck supple. No thyromegaly present.    Cardiovascular: Normal rate, regular rhythm, normal heart sounds and intact distal pulses.  Exam reveals no gallop and no friction rub.   No murmur heard. Pulmonary/Chest: Effort normal and breath sounds normal. She has no wheezes. She has no rales.  Abdominal: Soft. Bowel sounds are normal. She exhibits no distension and no mass. There is no tenderness. There is no rebound and no guarding.  Genitourinary: Vagina normal. No breast swelling, tenderness, discharge or bleeding. Pelvic exam was performed with patient supine. There is no rash, tenderness or lesion on the right labia. There is no rash, tenderness or lesion on the left labia. No erythema around the vagina.  No signs of injury around the vagina.  Musculoskeletal: Normal range of motion.  Lymphadenopathy:    She has no cervical adenopathy.  Neurological: She is alert and oriented to person, place, and time. She has normal reflexes.  Skin: Skin is warm and dry. No rash noted. She is not diaphoretic. No erythema. No pallor.  Diffuse sun related changes throughout extremities and upper torso.  Psychiatric: She has a normal mood and affect. Her behavior is normal. Judgment and thought content normal.  Tearful.   Results for orders placed in visit on 08/16/13  POCT URINALYSIS DIPSTICK      Result Value Range   Color, UA yellow     Clarity, UA clear     Glucose, UA neg     Bilirubin, UA neg     Ketones, UA neg     Spec Grav, UA 1.010     Blood, UA small     pH, UA 6.0     Protein, UA neg     Urobilinogen, UA 0.2     Nitrite, UA neg     Leukocytes, UA Negative    POCT UA - MICROSCOPIC ONLY      Result Value Range   WBC, Ur, HPF, POC neg     RBC, urine, microscopic 0-4     Bacteria, U Microscopic neg     Mucus, UA neg     Epithelial cells, urine per micros 0-9     Crystals, Ur, HPF, POC neg     Casts, Ur, LPF, POC neg     Yeast, UA neg     EKG: NSR.  SPIROMETRY:  FVC 54%, FEV1  44%, FEV1/FVC%  81%  --- SEVERE OBSTRUCTION.     Assessment & Plan:  Routine general medical examination at a health care facility - Plan: POCT urinalysis dipstick, CBC with Differential, CK, Comprehensive metabolic panel, Hemoglobin A1c, Lipid panel, TSH, Vit D  25 hydroxy (rtn osteoporosis monitoring), Vitamin B12, EKG 12-Lead, POCT UA - Microscopic Only, Pulmonary function test  Routine gynecological examination - Plan: Pap IG (Image Guided)  Cough  Tobacco abuse  Pure hypercholesterolemia  Anxiety state, unspecified  Hypothyroidism, postradioiodine therapy  Meds ordered this encounter  Medications  . zolpidem (AMBIEN) 10 MG tablet    Sig: Take 10 mg by mouth at bedtime as needed for sleep.  Marland Kitchen ALPRAZolam (XANAX) 0.25 MG tablet    Sig: Take 1 tablet (0.25 mg total) by mouth at bedtime as needed for anxiety.    Dispense:  30 tablet    Refill:  3  . cyclobenzaprine (FLEXERIL) 10 MG tablet    Sig: Take 1 tablet (10 mg total) by mouth 3 (three) times daily as needed.    Dispense:  60 tablet    Refill:  5  . levothyroxine (SYNTHROID, LEVOTHROID) 100 MCG tablet    Sig: Take one tablet by mouth daily on every day, except on Tuesdays and Thursdays take 1 1/2 tablets.    Dispense:  34 tablet    Refill:  5

## 2013-08-16 NOTE — Assessment & Plan Note (Signed)
New. Intermittent chronic issue; obtain spirometry; smoking cessation encouraged.

## 2013-08-16 NOTE — Assessment & Plan Note (Signed)
Pap smear obtained; mammogram UTD.

## 2013-08-16 NOTE — Patient Instructions (Signed)
1.  CHECK BLOOD PRESSURE ONCE WEEKLY AND KEEP DIARY/LOG; CALL IF BLOOD PRESSURE FREQUENTLY > 140/90. 2.  RECOMMEND STOPPING SMOKING. 3.  RECOMMEND WEIGHT LOSS AND EXERCISE.

## 2013-08-16 NOTE — Assessment & Plan Note (Signed)
Uncontrolled; tolerating Lovastatin 40mg  daily; obtain labs.

## 2013-08-16 NOTE — Assessment & Plan Note (Signed)
Anticipatory guidance --- weight loss, exercise, smoking cessation. Pap smear obtained; mammogram and colonoscopy UTD.  Immunizations UTD. Obtain labs.

## 2013-08-16 NOTE — Assessment & Plan Note (Signed)
Stable; obtain labs; continue current medication.

## 2013-08-16 NOTE — Assessment & Plan Note (Signed)
Contemplative.  Obtain spirometry. Cessation encouraged. Severe obstruction on spirometry; repeat at next visit.

## 2013-08-16 NOTE — Assessment & Plan Note (Signed)
Stable despite recent death of mother.  Counseling provided. Coping well with death of mother.  Refill of Xanax provided.

## 2013-08-17 LAB — COMPREHENSIVE METABOLIC PANEL
Albumin: 4.7 g/dL (ref 3.5–5.5)
BUN: 15 mg/dL (ref 6–24)
Chloride: 104 mmol/L (ref 97–108)
Creatinine, Ser: 0.79 mg/dL (ref 0.57–1.00)
GFR calc Af Amer: 99 mL/min/{1.73_m2} (ref >59)
Globulin, Total: 2.6 g/dL (ref 1.5–4.5)
Glucose: 95 mg/dL (ref 65–99)
Total Bilirubin: 0.3 mg/dL (ref 0.0–1.2)
Total Protein: 7.3 g/dL (ref 6.0–8.5)

## 2013-08-17 LAB — CBC WITH DIFFERENTIAL/PLATELET
Basos: 0 % (ref 0–3)
Eos: 1 % (ref 0–5)
HCT: 46.2 % (ref 34.0–46.6)
Hemoglobin: 15.3 g/dL (ref 11.1–15.9)
Immature Granulocytes: 0 % (ref 0–2)
Lymphocytes Absolute: 1.9 10*3/uL (ref 0.7–3.1)
MCV: 91 fL (ref 79–97)
Monocytes Absolute: 0.3 10*3/uL (ref 0.1–0.9)
Monocytes: 6 % (ref 4–12)
Neutrophils Absolute: 2.4 10*3/uL (ref 1.4–7.0)
RBC: 5.09 x10E6/uL (ref 3.77–5.28)
RDW: 13.7 % (ref 12.3–15.4)
WBC: 4.7 10*3/uL (ref 3.4–10.8)

## 2013-08-17 LAB — LIPID PANEL
Chol/HDL Ratio: 4.5 ratio units — ABNORMAL HIGH (ref 0.0–4.4)
HDL: 37 mg/dL — ABNORMAL LOW (ref >39)
Triglycerides: 134 mg/dL (ref 0–149)
VLDL Cholesterol Cal: 27 mg/dL (ref 5–40)

## 2013-08-17 LAB — HEMOGLOBIN A1C
Est. average glucose Bld gHb Est-mCnc: 120 mg/dL
Hgb A1c MFr Bld: 5.8 % — ABNORMAL HIGH (ref 4.8–5.6)

## 2013-08-19 LAB — PAP IG (IMAGE GUIDED)

## 2014-01-13 ENCOUNTER — Telehealth: Payer: Self-pay | Admitting: Family Medicine

## 2014-01-13 NOTE — Telephone Encounter (Signed)
She needs:  CBC, CMET, TSH, free T4, FLP, HgbA1c  Dx:  272.0, 244.9 for [employer].

## 2014-01-13 NOTE — Telephone Encounter (Signed)
Patient called stating she need a script to get her blood work done at [facility]  Patient can be contacted at [phone]

## 2014-01-13 NOTE — Telephone Encounter (Signed)
Please advise what labs needed and I will complete requisition

## 2014-01-17 ENCOUNTER — Other Ambulatory Visit: Payer: Self-pay | Admitting: Family Medicine

## 2014-01-17 NOTE — Telephone Encounter (Signed)
Please call in refill for Alprazolam.

## 2014-01-18 ENCOUNTER — Other Ambulatory Visit: Payer: Self-pay | Admitting: *Deleted

## 2014-01-18 ENCOUNTER — Other Ambulatory Visit: Payer: Self-pay | Admitting: Family Medicine

## 2014-01-18 DIAGNOSIS — E7800 Pure hypercholesterolemia, unspecified: Secondary | ICD-10-CM

## 2014-01-18 DIAGNOSIS — E039 Hypothyroidism, unspecified: Secondary | ICD-10-CM

## 2014-01-18 NOTE — Telephone Encounter (Signed)
Labs orderd. req's printed and mailed to pt. Pt notified.

## 2014-01-18 NOTE — Telephone Encounter (Signed)
Called in.

## 2014-01-19 NOTE — Telephone Encounter (Signed)
I approved this rx for refill on 01/17/14 to be called into pharmacy.  Have we called it in?

## 2014-02-14 ENCOUNTER — Ambulatory Visit (INDEPENDENT_AMBULATORY_CARE_PROVIDER_SITE_OTHER): Payer: 59 | Admitting: Family Medicine

## 2014-02-14 ENCOUNTER — Encounter: Payer: Self-pay | Admitting: Family Medicine

## 2014-02-14 VITALS — BP 159/100 | HR 97 | Temp 98.5°F | Resp 18 | Ht 64.75 in | Wt 255.2 lb

## 2014-02-14 DIAGNOSIS — I10 Essential (primary) hypertension: Secondary | ICD-10-CM | POA: Insufficient documentation

## 2014-02-14 DIAGNOSIS — F410 Panic disorder [episodic paroxysmal anxiety] without agoraphobia: Secondary | ICD-10-CM

## 2014-02-14 DIAGNOSIS — Z720 Tobacco use: Secondary | ICD-10-CM

## 2014-02-14 DIAGNOSIS — R7309 Other abnormal glucose: Secondary | ICD-10-CM

## 2014-02-14 DIAGNOSIS — R7302 Impaired glucose tolerance (oral): Secondary | ICD-10-CM

## 2014-02-14 DIAGNOSIS — I6529 Occlusion and stenosis of unspecified carotid artery: Secondary | ICD-10-CM | POA: Insufficient documentation

## 2014-02-14 DIAGNOSIS — R079 Chest pain, unspecified: Secondary | ICD-10-CM

## 2014-02-14 DIAGNOSIS — F17200 Nicotine dependence, unspecified, uncomplicated: Secondary | ICD-10-CM

## 2014-02-14 DIAGNOSIS — E669 Obesity, unspecified: Secondary | ICD-10-CM | POA: Insufficient documentation

## 2014-02-14 DIAGNOSIS — E7800 Pure hypercholesterolemia, unspecified: Secondary | ICD-10-CM

## 2014-02-14 DIAGNOSIS — F411 Generalized anxiety disorder: Secondary | ICD-10-CM

## 2014-02-14 DIAGNOSIS — E890 Postprocedural hypothyroidism: Secondary | ICD-10-CM

## 2014-02-14 MED ORDER — LEVOTHYROXINE SODIUM 100 MCG PO TABS: ORAL_TABLET | ORAL | Status: DC

## 2014-02-14 MED ORDER — LOVASTATIN 40 MG PO TABS: 40.0000 mg | ORAL_TABLET | Freq: Every day | ORAL | Status: DC

## 2014-02-14 MED ORDER — HYDROCHLOROTHIAZIDE 12.5 MG PO TABS: 12.5000 mg | ORAL_TABLET | Freq: Every day | ORAL | Status: DC

## 2014-02-14 NOTE — Progress Notes (Addendum)
This chart was scribed for Beth Honour, MD by Einar Pheasant, ED Scribe. This patient was seen in room 21 and the patient's care was started at 8:19 AM. Subjective:    Patient ID: Beth Hughes, female    DOB: [birth year], 53 y.o.   MRN: [id number]  02/14/2014  6 month check up, Hypothyroidism, Hyperlipidemia and Anxiety   HPI HPI Comments: Beth Hughes is a 53 y.o. female who presents to the [facility] for a 6 month check up. Pt is here for hypothyroidism, hyperlipidemia, and anxiety. Here last CPE was 08/16/13. All labs came back negative; pap smear was normal; spirometry showed severe obstruction.  Pt states that 2 weeks ago she started having an anxiety attack at work. She says that it felt like her chest was "thick", SOB, and palpitations.  She said these symptoms happened over the course of the week not just one day, and they only occured at work while at rest. She states that work has been very stressful, especially with trying to keep the other workers motivated. Pt states that she was sitting at her desk, at first she thought that it was her thyroid but it was not; TSH returned normal recently. Pt states that she had 3-4 episodes each lasting about 30-40 minutes. She says that she tried to alleviate her symptoms by deep breathing. Pt also reports associated nausea. Denies taking any Xanax for her attacks. She denies any associated chest pain. Pt has not had any more anxiety attacks since then.  Mrs. Bellows states that her mood has been stable. With occasional crying, especially when she remembers her Mother. Pt reports taking her prescribed Xanax every night, but none during the day. She says that she also drinks 2 cups of coffee and 2 diet Dr. Malachi Bonds daily. Pt states that she doesn't eat out much, but if she does she eats alfredo pasta. Advised pt to stay away from foods rich in carbohydrates and sugar.  She is still smoking. Pt states that quitting has been very hard  for her, but she hopes to quit soon. Spirometry at CPE in 08/2013 showed severe obstruction; she is smoking 1 ppd.    Low back pain has not changed. She says "not worse, not better but its still there". She is also complaining of chronic  neck stiffness. Pt states that when she stretches out her hands in front of her she can feel her fingers tingling. Denies any numbness or bilateral arm pain.  R hand fingers tingle when riding motorcycle.    Pt states that she doesn't know why her BP is elevated. She has not been experiencing any adverse symptoms. Advised her that her BP has been elevated every time she comes into the office for a check up. Told her that I am concerned about it so advised pt that a combination of medication and weight loss can aid in lowering her BP. Pt agreed with the proposed course of treatment.   Review of Systems  Constitutional: Negative for fever, chills, diaphoresis and fatigue.  Eyes: Negative for photophobia and visual disturbance.  Respiratory: Positive for shortness of breath. Negative for cough, wheezing and stridor.   Cardiovascular: Positive for palpitations. Negative for chest pain and leg swelling.  Gastrointestinal: Positive for nausea. Negative for vomiting, abdominal pain and diarrhea.  Musculoskeletal: Positive for back pain, myalgias, neck pain and neck stiffness.  Neurological: Positive for numbness. Negative for dizziness, syncope, speech difficulty, weakness, light-headedness and headaches.  Psychiatric/Behavioral: Positive for  sleep disturbance. Negative for suicidal ideas, self-injury, dysphoric mood and decreased concentration. The patient is nervous/anxious.     Past Medical History  Diagnosis Date  . Unspecified hypothyroidism   . Other and unspecified hyperlipidemia   . Insomnia, unspecified   . Tobacco use disorder   . Anxiety   . Abnormal LFTs (liver function tests)    Allergies  Allergen Reactions  . Penicillins     Blacked out  .  Pseudoephedrine     Heart races, seizures   Current Outpatient Prescriptions  Medication Sig Dispense Refill  . ALPRAZolam (XANAX) 0.25 MG tablet TAKE ONE TABLET BY MOUTH AT BEDTIME AS NEEDED FOR ANXIETY  30 tablet  2  . cyclobenzaprine (FLEXERIL) 10 MG tablet Take 1 tablet (10 mg total) by mouth 3 (three) times daily as needed.  60 tablet  5  . levothyroxine (SYNTHROID, LEVOTHROID) 100 MCG tablet Take one tablet by mouth daily on every day, except on Tuesdays and Thursdays take 1 1/2 tablets.  105 tablet  3  . lovastatin (MEVACOR) 40 MG tablet Take 1 tablet (40 mg total) by mouth at bedtime.  90 tablet  3  . zolpidem (AMBIEN) 10 MG tablet Take 10 mg by mouth at bedtime as needed for sleep.      . hydrochlorothiazide (HYDRODIURIL) 12.5 MG tablet Take 1 tablet (12.5 mg total) by mouth daily.  90 tablet  3  . meloxicam (MOBIC) 15 MG tablet Take 1 tablet (15 mg total) by mouth daily.  30 tablet  0   No current facility-administered medications for this visit.   History   Social History  . Marital Status: Unknown    Spouse Name: N/A    Number of Children: N/A  . Years of Education: N/A   Occupational History  . [employer] Freight forwarder    Social History Main Topics  . Smoking status: Current Every Day Smoker -- 1.00 packs/day for 30 years  . Smokeless tobacco: Never Used  . Alcohol Use: No  . Drug Use: No  . Sexual Activity: Not Currently     Comment: Last sexual activity 2004.   Other Topics Concern  . Not on file   Social History Narrative   Marital status: single; not dating.  Not interested.      Children; one son (46); no grandchildren      Lives: with son.        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      Tobacco: 1 ppd x 35 years.      Alcohol: none      Drugs: none      Exercise: none   Family History  Problem Relation Age of Onset  . Congestive Heart Failure Mother   . Diabetes Mother   . COPD Mother   . Atrial fibrillation Mother   . Macular  degeneration Mother   . Heart disease Mother 51    CABG  . Stroke Mother   . Aneurysm Mother   . Cancer Sister 47    breast cancer  . Alcohol abuse Brother   . COPD Brother   . Congestive Heart Failure Brother   . Diabetes Brother         Objective:    Triage Vitals:BP 159/100  Pulse 97  Temp(Src) 98.5 F (36.9 C) (Oral)  Resp 18  Ht 5' 4.75" (1.645 m)  Wt 255 lb 3.2 oz (115.758 kg)  BMI 42.78 kg/m2  SpO2 95%  Physical Exam  Nursing  note and vitals reviewed. Constitutional: She is oriented to person, place, and time. She appears well-developed and well-nourished. No distress.  HENT:  Head: Normocephalic and atraumatic.  Right Ear: External ear normal.  Left Ear: External ear normal.  Nose: Nose normal.  Mouth/Throat: Oropharynx is clear and moist. No oropharyngeal exudate.  Eyes: Conjunctivae and EOM are normal. Pupils are equal, round, and reactive to light. Right eye exhibits no discharge.  Neck: Normal range of motion. Neck supple. Carotid bruit is not present. No tracheal deviation present. No thyromegaly present.  Cardiovascular: Normal rate, regular rhythm, normal heart sounds and intact distal pulses.  Exam reveals no gallop and no friction rub.   No murmur heard. Pulmonary/Chest: Effort normal and breath sounds normal. No respiratory distress. She has no wheezes. She has no rales. She exhibits no tenderness.  Abdominal: Soft. Bowel sounds are normal. She exhibits no distension and no mass. There is no tenderness. There is no rebound and no guarding.  Musculoskeletal:       Cervical back: She exhibits decreased range of motion.       Lumbar back: She exhibits normal range of motion.  Lymphadenopathy:    She has no cervical adenopathy.  Neurological: She is alert and oriented to person, place, and time. No cranial nerve deficit.  Skin: Skin is warm and dry.  Psychiatric: She has a normal mood and affect. Her behavior is normal. Judgment and thought content  normal.   EKG: NSR: no ST changes.     Assessment & Plan:   1. Chest pain   2. Anxiety state, unspecified   3. Hypothyroidism, postradioiodine therapy   4. Pure hypercholesterolemia   5. Tobacco abuse   6. Glucose intolerance (impaired glucose tolerance)   7. Panic attack   8. Carotid stenosis   9. Essential hypertension, benign   10. Obesity, unspecified    1. Chest pain/pressure/palpitations/SOB:  New.  Consistent with panic attacks yet multiple cardiac risk factors; thus, refer for stress testing.   2.  Anxiety:  Worsening due to multiple work and personal stressors; continues to grieve the death of mother; coping well; recommend regular exercise and limitation of caffeine.  Continue Xanax at bedtime. 3.  Hypothyroidism post-ablative: controlled; TSH 3.8; no changes to therapy.  4.  Tobacco abuse: contemplative due to insurance premiums.  Severe obstruction on spirometry 08/2013; repeat at next visit.   5.  Glucose intolerance: New.  Improved since last visit.  Recommend weight loss, exercise, low-sugar food choices. 6.  Panic attacks:  New.  Secondary to work stressors. 7.  Carotid stenosis: chronic; repeat carotid dopplers. 8.  HTN:  New.  Start HCTZ 12.5mg  one tablet daily.  Follow-up two months. 9.  Obesity: persistent; highly recommend weight loss, exercise, low-fat food choices.  Meds ordered this encounter  Medications  . lovastatin (MEVACOR) 40 MG tablet    Sig: Take 1 tablet (40 mg total) by mouth at bedtime.    Dispense:  90 tablet    Refill:  3  . levothyroxine (SYNTHROID, LEVOTHROID) 100 MCG tablet    Sig: Take one tablet by mouth daily on every day, except on Tuesdays and Thursdays take 1 1/2 tablets.    Dispense:  105 tablet    Refill:  3  . hydrochlorothiazide (HYDRODIURIL) 12.5 MG tablet    Sig: Take 1 tablet (12.5 mg total) by mouth daily.    Dispense:  90 tablet    Refill:  3    Return in about 2 months (  around 04/16/2014) for recheck high blood pressure,  panic attack.    I personally performed the services described in this documentation, which was scribed in my presence.  The recorded information has been reviewed and is accurate.  Reginia Forts, M.D.  [facility] [address] [place], [place]  [phone] phone [phone] fax

## 2014-04-19 ENCOUNTER — Ambulatory Visit: Payer: 59 | Admitting: Family Medicine

## 2014-04-20 ENCOUNTER — Ambulatory Visit (INDEPENDENT_AMBULATORY_CARE_PROVIDER_SITE_OTHER): Payer: 59 | Admitting: Family Medicine

## 2014-04-20 ENCOUNTER — Encounter: Payer: Self-pay | Admitting: Family Medicine

## 2014-04-20 VITALS — BP 138/84 | HR 91 | Temp 98.7°F | Resp 16 | Ht 65.0 in | Wt 247.2 lb

## 2014-04-20 DIAGNOSIS — F410 Panic disorder [episodic paroxysmal anxiety] without agoraphobia: Secondary | ICD-10-CM

## 2014-04-20 DIAGNOSIS — R079 Chest pain, unspecified: Secondary | ICD-10-CM

## 2014-04-20 DIAGNOSIS — M79674 Pain in right toe(s): Secondary | ICD-10-CM

## 2014-04-20 DIAGNOSIS — I6529 Occlusion and stenosis of unspecified carotid artery: Secondary | ICD-10-CM

## 2014-04-20 DIAGNOSIS — M79609 Pain in unspecified limb: Secondary | ICD-10-CM

## 2014-04-20 DIAGNOSIS — I10 Essential (primary) hypertension: Secondary | ICD-10-CM

## 2014-04-20 MED ORDER — ALPRAZOLAM 0.25 MG PO TABS: 0.2500 mg | ORAL_TABLET | Freq: Every day | ORAL | Status: DC | PRN

## 2014-04-20 MED ORDER — CYCLOBENZAPRINE HCL 10 MG PO TABS: 10.0000 mg | ORAL_TABLET | Freq: Three times a day (TID) | ORAL | Status: DC | PRN

## 2014-04-20 NOTE — Progress Notes (Signed)
This chart was scribed for [name], MD by Allena Earing, ED Scribe. This patient was seen in room 21 and the patient's care was started at 8:18 AM .  Subjective:    Patient ID: Beth Hughes, female    DOB: June 05, 1961, 53 y.o.   MRN: [id number]   Chief Complaint  Patient presents with   Hypertension   panic attack   Medication Refill    Alprazolam and Flexeril   right pinky toe injured , kicked the Wii board x 1 wk ago    HPI  HPI Comments: Beth Hughes is a 53 y.o. female who presents to the [facility] for a 2 months check up following being put on medication for her high BP.  Her BP during her last visit 2 months ago was 159/100, and she now reports that since taking medication her bottom number has not gotten above 90. She reports that she has begun Weight [employer] and has a plan to begin working out and to quit smoking. She also states that her CP has diminished and that her panic attacks are getting better, she states that she had 2 panic attacks in the past two months.   She also reports that she has injured her right pinky toe, she kicked a Wii board 1 week ago while carrying something through the living room. She states that the toe is sore and she "believes that it is broken".  Pt has regularly been measuring her BP at home.  Tolerating HCTZ 12.5mg  daily without side effects.  Pt started on BP medication 2 months ago, is her for a f/u.   Pt needed refills on her Flexeril and her Xanax. She reports taking Xanax 3-4 times a week and her Flexeril 1-2 times a week; usually takes Flexeril qhs.   She was never notified regarding stress testing or carotid dopplers.    Mother's day was difficult considering the death of mother in the past year.   Past Medical History  Diagnosis Date   Unspecified hypothyroidism    Other and unspecified hyperlipidemia    Insomnia, unspecified    Tobacco use disorder    Anxiety    Abnormal LFTs (liver  function tests)     Current Outpatient Prescriptions on File Prior to Visit  Medication Sig Dispense Refill   hydrochlorothiazide (HYDRODIURIL) 12.5 MG tablet Take 1 tablet (12.5 mg total) by mouth daily.  90 tablet  3   levothyroxine (SYNTHROID, LEVOTHROID) 100 MCG tablet Take one tablet by mouth daily on every day, except on Tuesdays and Thursdays take 1 1/2 tablets.  105 tablet  3   lovastatin (MEVACOR) 40 MG tablet Take 1 tablet (40 mg total) by mouth at bedtime.  90 tablet  3   zolpidem (AMBIEN) 10 MG tablet Take 10 mg by mouth at bedtime as needed for sleep.       meloxicam (MOBIC) 15 MG tablet Take 1 tablet (15 mg total) by mouth daily.  30 tablet  0   No current facility-administered medications on file prior to visit.    Allergies  Allergen Reactions   Penicillins     Blacked out   Pseudoephedrine     Heart races, seizures   History   Social History   Marital Status: Unknown    Spouse Name: N/A    Number of Children: N/A   Years of Education: N/A   Occupational History   Arts administrator    Social History Main Topics  Smoking status: Current Every Day Smoker -- 1.00 packs/day for 30 years   Smokeless tobacco: Never Used   Alcohol Use: No   Drug Use: No   Sexual Activity: Not Currently     Comment: Last sexual activity 2004.   Other Topics Concern   Not on file   Social History Narrative   Marital status: single; not dating.  Not interested.      Children; one son (37); no grandchildren      Lives: with son.        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      Tobacco: 1 ppd x 35 years.      Alcohol: none      Drugs: none      Exercise: none   Family History  Problem Relation Age of Onset   Congestive Heart Failure Mother    Diabetes Mother    COPD Mother    Atrial fibrillation Mother    Macular degeneration Mother    Heart disease Mother 64    CABG   Stroke Mother    Aneurysm Mother    Cancer Sister 72     breast cancer   Alcohol abuse Brother    COPD Brother    Congestive Heart Failure Brother    Diabetes Brother     Review of Systems  Constitutional: Negative for unexpected weight change.  Respiratory: Negative for cough and shortness of breath.   Cardiovascular: Negative for chest pain, palpitations and leg swelling.  Genitourinary: Negative for dysuria, urgency and difficulty urinating.  Musculoskeletal: Positive for arthralgias and gait problem.       Right pinky toe  Skin: Negative for rash.  Neurological: Negative for dizziness, light-headedness and headaches.  Psychiatric/Behavioral: Positive for dysphoric mood. Negative for confusion and sleep disturbance. The patient is nervous/anxious (she has good days and bad days, she misses her daughter).        Objective:   Physical Exam  Nursing note and vitals reviewed. Constitutional: She is oriented to person, place, and time. She appears well-developed and well-nourished. No distress.  HENT:  Head: Normocephalic and atraumatic.  Mouth/Throat: Oropharynx is clear and moist.  Eyes: Conjunctivae and EOM are normal. Pupils are equal, round, and reactive to light. No scleral icterus.  Neck: Normal range of motion. Neck supple. No thyromegaly present.  Cardiovascular: Normal rate, regular rhythm, S1 normal, S2 normal and normal heart sounds.  Exam reveals no gallop and no friction rub.   No murmur heard. Pulmonary/Chest: Effort normal and breath sounds normal. No stridor. She has no wheezes. She has no rales. She exhibits no tenderness.  Abdominal: She exhibits no distension. There is no tenderness. There is no rebound.  Musculoskeletal: Normal range of motion. She exhibits no edema.  Tenderness to palpation R proximal 5th toe with ecchymosis   Lymphadenopathy:    She has no cervical adenopathy.  Neurological: She is alert and oriented to person, place, and time. She exhibits normal muscle tone. Coordination normal.  Skin: No  rash noted. She is not diaphoretic. No erythema.  Psychiatric: She has a normal mood and affect. Her behavior is normal. Judgment and thought content normal.    Filed Vitals:   04/20/14 0803  BP: 138/84  Pulse: 91  Temp: 98.7 F (37.1 C)  Resp: 16         Assessment & Plan:  Carotid stenosis  Essential hypertension, benign  Panic attack  Pain in toe of right foot  Chest  pain  1. Carotid stenosis: stable; refer for repeat carotid doppler; did not receive referral at last visit. 2.  Chest pain:  Improved; did not receive referral for cardiolite at last visit; refer again.  To ED for recurrent chest pain. 3.  Panic attacks: improved since last visit; refill for Xanax provided; coping well with stressors and with death of mother. 4.  HTN: improved control; obtain CMET at [employer]; no changes to therapy. 5.  Pain R fifth toe:  New.  Traumatic; pt declined xray; recommend supportive shoe, icing. 6. Obesity: with recent weight loss.   Meds ordered this encounter  Medications   cyclobenzaprine (FLEXERIL) 10 MG tablet    Sig: Take 1 tablet (10 mg total) by mouth 3 (three) times daily as needed.    Dispense:  60 tablet    Refill:  5   ALPRAZolam (XANAX) 0.25 MG tablet    Sig: Take 1 tablet (0.25 mg total) by mouth daily as needed for anxiety.    Dispense:  30 tablet    Refill:  5    8:31 AM-Discussed treatment plan which includes refills on her medications, her lab work, a stress test, her emotional well being, and her general wellness plan with pt at bedside and pt agreed to plan.    I personally performed the services described in this documentation, which was scribed in my presence. The recorded information has been reviewed and is accurate.  Reginia Forts, M.D.  [facility] [address] [place], [place]  [address] [phone] phone [phone] fax

## 2014-04-22 ENCOUNTER — Encounter: Payer: Self-pay | Admitting: Family Medicine

## 2014-04-26 ENCOUNTER — Telehealth: Payer: Self-pay

## 2014-04-26 NOTE — Telephone Encounter (Signed)
[facility] needs orders signed by Dr. Tamala Julian.  Patient has an appointment tomorrow at 9 am.   [facility]  Callback number is [phone]

## 2014-04-27 ENCOUNTER — Ambulatory Visit: Payer: Self-pay | Admitting: Family Medicine

## 2014-04-27 ENCOUNTER — Telehealth: Payer: Self-pay | Admitting: *Deleted

## 2014-04-29 NOTE — Telephone Encounter (Signed)
Form signed and to be faxed.

## 2014-05-06 NOTE — Telephone Encounter (Signed)
error 

## 2014-05-17 ENCOUNTER — Encounter: Payer: Self-pay | Admitting: Family Medicine

## 2014-06-20 ENCOUNTER — Encounter: Payer: Self-pay | Admitting: Family Medicine

## 2014-07-05 ENCOUNTER — Telehealth: Payer: Self-pay

## 2014-07-05 DIAGNOSIS — Z1239 Encounter for other screening for malignant neoplasm of breast: Secondary | ICD-10-CM

## 2014-07-05 NOTE — Telephone Encounter (Signed)
Pt needs a referral to [facility] for her annual mammogram   Best number [phone]

## 2014-07-06 NOTE — Telephone Encounter (Signed)
Sent, pt aware referral dept will be contacting her.

## 2014-09-26 ENCOUNTER — Ambulatory Visit: Payer: Self-pay | Admitting: Family Medicine

## 2014-09-26 LAB — HM MAMMOGRAPHY

## 2014-10-05 ENCOUNTER — Encounter: Payer: Self-pay | Admitting: *Deleted

## 2014-10-17 ENCOUNTER — Encounter: Payer: Self-pay | Admitting: Family Medicine

## 2014-10-19 ENCOUNTER — Ambulatory Visit (INDEPENDENT_AMBULATORY_CARE_PROVIDER_SITE_OTHER): Payer: 59 | Admitting: Family Medicine

## 2014-10-19 ENCOUNTER — Encounter: Payer: Self-pay | Admitting: Family Medicine

## 2014-10-19 VITALS — BP 165/80 | HR 99 | Temp 98.2°F | Resp 16 | Ht 64.75 in | Wt 218.6 lb

## 2014-10-19 DIAGNOSIS — E032 Hypothyroidism due to medicaments and other exogenous substances: Secondary | ICD-10-CM

## 2014-10-19 DIAGNOSIS — Z86010 Personal history of colonic polyps: Secondary | ICD-10-CM

## 2014-10-19 DIAGNOSIS — Z720 Tobacco use: Secondary | ICD-10-CM

## 2014-10-19 DIAGNOSIS — E669 Obesity, unspecified: Secondary | ICD-10-CM

## 2014-10-19 DIAGNOSIS — Z0000 Encounter for general adult medical examination without abnormal findings: Secondary | ICD-10-CM

## 2014-10-19 DIAGNOSIS — E785 Hyperlipidemia, unspecified: Secondary | ICD-10-CM

## 2014-10-19 DIAGNOSIS — Z23 Encounter for immunization: Secondary | ICD-10-CM

## 2014-10-19 DIAGNOSIS — I10 Essential (primary) hypertension: Secondary | ICD-10-CM

## 2014-10-19 DIAGNOSIS — G4710 Hypersomnia, unspecified: Secondary | ICD-10-CM

## 2014-10-19 DIAGNOSIS — R0683 Snoring: Secondary | ICD-10-CM

## 2014-10-19 DIAGNOSIS — F411 Generalized anxiety disorder: Secondary | ICD-10-CM

## 2014-10-19 DIAGNOSIS — R7302 Impaired glucose tolerance (oral): Secondary | ICD-10-CM

## 2014-10-19 LAB — CBC WITH DIFFERENTIAL/PLATELET
BASOS PCT: 0 % (ref 0–1)
Basophils Absolute: 0 10*3/uL (ref 0.0–0.1)
EOS ABS: 0.1 10*3/uL (ref 0.0–0.7)
Eosinophils Relative: 2 % (ref 0–5)
HEMATOCRIT: 47.1 % — AB (ref 36.0–46.0)
Hemoglobin: 15.9 g/dL — ABNORMAL HIGH (ref 12.0–15.0)
Lymphocytes Relative: 39 % (ref 12–46)
Lymphs Abs: 2.6 10*3/uL (ref 0.7–4.0)
MCH: 31 pg (ref 26.0–34.0)
MCHC: 33.8 g/dL (ref 30.0–36.0)
MCV: 91.8 fL (ref 78.0–100.0)
MONOS PCT: 7 % (ref 3–12)
Monocytes Absolute: 0.5 10*3/uL (ref 0.1–1.0)
Neutro Abs: 3.5 10*3/uL (ref 1.7–7.7)
Neutrophils Relative %: 52 % (ref 43–77)
Platelets: 244 10*3/uL (ref 150–400)
RBC: 5.13 MIL/uL — ABNORMAL HIGH (ref 3.87–5.11)
RDW: 13.6 % (ref 11.5–15.5)
WBC: 6.7 10*3/uL (ref 4.0–10.5)

## 2014-10-19 LAB — HEMOGLOBIN A1C
Hgb A1c MFr Bld: 5.4 % (ref <5.7)
MEAN PLASMA GLUCOSE: 108 mg/dL (ref <117)

## 2014-10-19 LAB — COMPREHENSIVE METABOLIC PANEL
ALK PHOS: 68 U/L (ref 39–117)
ALT: 18 U/L (ref 0–35)
AST: 18 U/L (ref 0–37)
Albumin: 4.1 g/dL (ref 3.5–5.2)
BILIRUBIN TOTAL: 0.4 mg/dL (ref 0.2–1.2)
BUN: 13 mg/dL (ref 6–23)
CO2: 24 mEq/L (ref 19–32)
Calcium: 9.4 mg/dL (ref 8.4–10.5)
Chloride: 105 mEq/L (ref 96–112)
Creat: 0.78 mg/dL (ref 0.50–1.10)
GLUCOSE: 85 mg/dL (ref 70–99)
POTASSIUM: 4.5 meq/L (ref 3.5–5.3)
Sodium: 140 mEq/L (ref 135–145)
Total Protein: 7.1 g/dL (ref 6.0–8.3)

## 2014-10-19 LAB — POCT URINALYSIS DIPSTICK
BILIRUBIN UA: NEGATIVE
GLUCOSE UA: NEGATIVE
Ketones, UA: NEGATIVE
Leukocytes, UA: NEGATIVE
Nitrite, UA: NEGATIVE
Protein, UA: NEGATIVE
SPEC GRAV UA: 1.015
Urobilinogen, UA: 0.2
pH, UA: 6.5

## 2014-10-19 LAB — TSH: TSH: 1.381 u[IU]/mL (ref 0.350–4.500)

## 2014-10-19 LAB — LIPID PANEL
CHOL/HDL RATIO: 3.7 ratio
CHOLESTEROL: 147 mg/dL (ref 0–200)
HDL: 40 mg/dL (ref >39)
LDL Cholesterol: 80 mg/dL (ref 0–99)
Triglycerides: 135 mg/dL (ref <150)
VLDL: 27 mg/dL (ref 0–40)

## 2014-10-19 LAB — T4, FREE: Free T4: 1.34 ng/dL (ref 0.80–1.80)

## 2014-10-19 MED ORDER — ALPRAZOLAM 0.5 MG PO TABS: 0.5000 mg | ORAL_TABLET | Freq: Every evening | ORAL | Status: DC | PRN

## 2014-10-19 MED ORDER — LISINOPRIL-HYDROCHLOROTHIAZIDE 10-12.5 MG PO TABS: 1.0000 | ORAL_TABLET | Freq: Every day | ORAL | Status: DC

## 2014-10-19 NOTE — Progress Notes (Signed)
Subjective:    Patient ID: Beth Hughes, female    DOB: 03/11/1961, 53 y.o.   MRN: [id number]  10/19/2014  Annual Exam   HPI This 53 y.o. female presents for Complete Physical Examination.  Last physical: 08/16/13 Pap smear:  Hysterectomy 2000 [place] ovarian cysts, DUB, fibroids.  Ovaries removed.    Mammogram: 09/26/14 normal.  [facility]:  2010 [facility]; +colon polyps; [facility].  Repeat in 2015. Bone density: 2009.    Egg whites every morning; no cheese; no milk.  Ice cream qhs [employer]. TDAP:  04/23/2011. Pneumovax:  04/23/2011 Zostavax:  never Influenza:  Gets annually at work. Eye exam:  [facility]; 2015; +glasses.  No g/c.   No macular degeneration; mother with MD. Dental exam: upper and lower plates.   Anxiety:  Taking Xanax daily; things are very stressful.  Taking Xanax qhs.  Helps relax at night.  Would like to increase to bid dosing or increase dose at bedtime.  Brother got 63 years of prison.  Ripped family apart.  Doing well during the day.  Nights are tough.  No SI/HI.    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  Chronic issue.  Suffers with frequent neck pain. No radiation; no n/t/w.  S/p evaluation by ortho ten years ago.  PT did not help.  No previous injections.    HTN: forgets medication a lot due to afternoon dosing schedule  Getting headaches frequently.  Running 140/85-90.  Hypothyroidism: Patient reports good compliance with medication, good tolerance to medication, and good symptom control.  Due for labs.    Fatigue: suffers with a lot of fatigue. Snores.  Son concerned about excessive fatigue.  [employer] records that patient has 20 nighttime events.  Never naps.     Review of Systems  Constitutional: Positive for fatigue. Negative for fever, chills, diaphoresis, activity change, appetite change and unexpected weight change.  HENT: Negative for congestion, dental problem, drooling, ear discharge, ear pain, facial swelling, hearing loss, mouth  sores, nosebleeds, postnasal drip, rhinorrhea, sinus pressure, sneezing, sore throat, tinnitus, trouble swallowing and voice change.   Eyes: Negative for photophobia, pain, discharge, redness, itching and visual disturbance.  Respiratory: Negative for apnea, cough, choking, chest tightness, shortness of breath, wheezing and stridor.   Cardiovascular: Negative for chest pain, palpitations and leg swelling.  Gastrointestinal: Negative for nausea, vomiting, abdominal pain, diarrhea, constipation, blood in stool, abdominal distention, anal bleeding and rectal pain.  Endocrine: Negative for cold intolerance, heat intolerance, polydipsia, polyphagia and polyuria.  Genitourinary: Negative for dysuria, urgency, frequency, hematuria, flank pain, decreased urine volume, vaginal bleeding, vaginal discharge, enuresis, difficulty urinating, genital sores, vaginal pain, menstrual problem, pelvic pain and dyspareunia.  Musculoskeletal: Positive for neck pain and neck stiffness. Negative for myalgias, back pain, joint swelling, arthralgias and gait problem.  Skin: Negative for color change, pallor, rash and wound.  Allergic/Immunologic: Negative for environmental allergies, food allergies and immunocompromised state.  Neurological: Negative for dizziness, tremors, seizures, syncope, facial asymmetry, speech difficulty, weakness, light-headedness, numbness and headaches.  Hematological: Negative for adenopathy. Does not bruise/bleed easily.  Psychiatric/Behavioral: Negative for suicidal ideas, hallucinations, behavioral problems, confusion, sleep disturbance, self-injury, dysphoric mood, decreased concentration and agitation. The patient is nervous/anxious. The patient is not hyperactive.     Past Medical History  Diagnosis Date  . Unspecified hypothyroidism   . Other and unspecified hyperlipidemia   . Insomnia, unspecified   . Tobacco use disorder   . Anxiety   . Abnormal LFTs (liver function tests)   .  Snoring 10/21/2014   Past Surgical History  Procedure Laterality Date  . Abdominal hysterectomy  12/09/1998    ovarian cysts; B ooophorectomy  . Colonoscopy w/ polypectomy  12/09/2008    colon polyps. I[facility].  Repeat in 5 years.   Allergies  Allergen Reactions  . Penicillins     Blacked out  . Pseudoephedrine     Heart races, seizures   Current Outpatient Prescriptions  Medication Sig Dispense Refill  . ALPRAZolam (XANAX) 0.5 MG tablet Take 1 tablet (0.5 mg total) by mouth at bedtime as needed for anxiety. 30 tablet 5  . cyclobenzaprine (FLEXERIL) 10 MG tablet Take 1 tablet (10 mg total) by mouth 3 (three) times daily as needed. 60 tablet 5  . hydrochlorothiazide (HYDRODIURIL) 12.5 MG tablet Take 1 tablet (12.5 mg total) by mouth daily. 90 tablet 3  . levothyroxine (SYNTHROID, LEVOTHROID) 100 MCG tablet Take one tablet by mouth daily on every day, except on Tuesdays and Thursdays take 1 1/2 tablets. 105 tablet 3  . lisinopril-hydrochlorothiazide (PRINZIDE,ZESTORETIC) 10-12.5 MG per tablet Take 1 tablet by mouth daily. 90 tablet 3  . lovastatin (MEVACOR) 40 MG tablet Take 1 tablet (40 mg total) by mouth at bedtime. 90 tablet 3  . zolpidem (AMBIEN) 10 MG tablet Take 10 mg by mouth at bedtime as needed for sleep.     No current facility-administered medications for this visit.       Objective:    BP 165/80 mmHg  Pulse 99  Temp(Src) 98.2 F (36.8 C) (Oral)  Resp 16  Ht 5' 4.75" (1.645 m)  Wt 218 lb 9.6 oz (99.156 kg)  BMI 36.64 kg/m2  SpO2 97% Physical Exam  Constitutional: She is oriented to person, place, and time. She appears well-developed and well-nourished. No distress.  HENT:  Head: Normocephalic and atraumatic.  Right Ear: External ear normal.  Left Ear: External ear normal.  Nose: Nose normal.  Mouth/Throat: Oropharynx is clear and moist.  Eyes: Conjunctivae and EOM are normal. Pupils are equal, round, and reactive to light.  Neck: Normal range of motion and full  passive range of motion without pain. Neck supple. No JVD present. Carotid bruit is not present. No thyromegaly present.  Cardiovascular: Normal rate, regular rhythm and normal heart sounds.  Exam reveals no gallop and no friction rub.   No murmur heard. Pulmonary/Chest: Effort normal and breath sounds normal. She has no wheezes. She has no rales. Right breast exhibits no inverted nipple, no mass, no nipple discharge, no skin change and no tenderness. Left breast exhibits no inverted nipple, no mass, no nipple discharge, no skin change and no tenderness. Breasts are symmetrical.  Abdominal: Soft. Bowel sounds are normal. She exhibits no distension and no mass. There is no tenderness. There is no rebound and no guarding.  Genitourinary: Rectum normal and vagina normal. There is no rash, tenderness or lesion on the right labia. There is no rash, tenderness or lesion on the left labia.  Musculoskeletal:       Right shoulder: Normal.       Left shoulder: Normal.       Cervical back: Normal.  Lymphadenopathy:    She has no cervical adenopathy.  Neurological: She is alert and oriented to person, place, and time. She has normal reflexes. No cranial nerve deficit. She exhibits normal muscle tone. Coordination normal.  Skin: Skin is warm and dry. No rash noted. She is not diaphoretic. No erythema. No pallor.  Psychiatric: She has a normal mood and  affect. Her behavior is normal. Judgment and thought content normal.  Nursing note and vitals reviewed.   INFLUENZA VACCINE ADMINISTERED.    Assessment & Plan:   1. Essential hypertension   2. Physical exam, routine   3. History of colonic polyps   4. Hypersomnolence   5. Snoring   6. Hyperlipidemia   7. Glucose intolerance (impaired glucose tolerance)   8. Hypothyroidism due to medication   9. Need for prophylactic vaccination and inoculation against influenza       1.  Complete Physical Examination: anticipatory guidance --- exercise, weight  loss, smoking cessation, 3 servings of dairy daily.  Does not warrant pap smear due to hysterectomy status.  Mammogram UTD.  Refer for colonoscopy.  Immunizations UTD; s/p influenza vaccine.   2.  HTN: uncontrolled; switch to Lisinopril-HCTZ 10-12.5mg  one daily; obtain labs, u/a, EKG. 3.  Colon polyp hx: due for repeat colonoscopy; refer to GI. 4.  Hypersomnolence with snoring:  New. Refer for sleep study. 5.  Hyperlipidemia: controlled; refill provided.  Obtain labs. 6.  Glucose intolerance: stable; obtain labs; recommend weight loss, exercise, low-sugar and low-carbohydrate food choices. 7.  Hypothyroidism s/p ablation: controlled; obtain labs; refill provided. 8.  S/p flu vaccine. 9. Anxiety: worsening; agreeable to increasing Xanax to 0.5mg  qhs.  If needing bid dosing of Xanax, needs to start SSRI and pt not interested at this time.    Meds ordered this encounter  Medications  . ALPRAZolam (XANAX) 0.5 MG tablet    Sig: Take 1 tablet (0.5 mg total) by mouth at bedtime as needed for anxiety.    Dispense:  30 tablet    Refill:  5  . lisinopril-hydrochlorothiazide (PRINZIDE,ZESTORETIC) 10-12.5 MG per tablet    Sig: Take 1 tablet by mouth daily.    Dispense:  90 tablet    Refill:  3    Return in about 6 months (around 04/19/2015) for recheck.    Reginia Forts, M.D.  [facility] [address] [place], [place]  [address] [phone] phone [phone] fax

## 2014-10-19 NOTE — Patient Instructions (Signed)

## 2014-10-21 ENCOUNTER — Ambulatory Visit (INDEPENDENT_AMBULATORY_CARE_PROVIDER_SITE_OTHER): Payer: 59 | Admitting: Neurology

## 2014-10-21 ENCOUNTER — Encounter: Payer: Self-pay | Admitting: Neurology

## 2014-10-21 VITALS — BP 126/81 | HR 82 | Temp 97.9°F | Resp 12 | Ht 65.5 in | Wt 219.0 lb

## 2014-10-21 DIAGNOSIS — R351 Nocturia: Secondary | ICD-10-CM

## 2014-10-21 DIAGNOSIS — R0683 Snoring: Secondary | ICD-10-CM

## 2014-10-21 HISTORY — DX: Snoring: R06.83

## 2014-10-21 NOTE — Patient Instructions (Signed)
Smoking Cessation Quitting smoking is important to your health and has many advantages. However, it is not always easy to quit since nicotine is a very addictive drug. Oftentimes, people try 3 times or more before being able to quit. This document explains the best ways for you to prepare to quit smoking. Quitting takes hard work and a lot of effort, but you can do it. ADVANTAGES OF QUITTING SMOKING  You will live longer, feel better, and live better.  Your body will feel the impact of quitting smoking almost immediately.  Within 20 minutes, blood pressure decreases. Your pulse returns to its normal level.  After 8 hours, carbon monoxide levels in the blood return to normal. Your oxygen level increases.  After 24 hours, the chance of having a heart attack starts to decrease. Your breath, hair, and body stop smelling like smoke.  After 48 hours, damaged nerve endings begin to recover. Your sense of taste and smell improve.  After 72 hours, the body is virtually free of nicotine. Your bronchial tubes relax and breathing becomes easier.  After 2 to 12 weeks, lungs can hold more air. Exercise becomes easier and circulation improves.  The risk of having a heart attack, stroke, cancer, or lung disease is greatly reduced.  After 1 year, the risk of coronary heart disease is cut in half.  After 5 years, the risk of stroke falls to the same as a nonsmoker.  After 10 years, the risk of lung cancer is cut in half and the risk of other cancers decreases significantly.  After 15 years, the risk of coronary heart disease drops, usually to the level of a nonsmoker.  If you are pregnant, quitting smoking will improve your chances of having a healthy baby.  The people you live with, especially any children, will be healthier.  You will have extra money to spend on things other than cigarettes. QUESTIONS TO THINK ABOUT BEFORE ATTEMPTING TO QUIT You may want to talk about your answers with your  health care provider.  Why do you want to quit?  If you tried to quit in the past, what helped and what did not?  What will be the most difficult situations for you after you quit? How will you plan to handle them?  Who can help you through the tough times? Your family? Friends? A health care provider?  What pleasures do you get from smoking? What ways can you still get pleasure if you quit? Here are some questions to ask your health care provider:  How can you help me to be successful at quitting?  What medicine do you think would be best for me and how should I take it?  What should I do if I need more help?  What is smoking withdrawal like? How can I get information on withdrawal? GET READY  Set a quit date.  Change your environment by getting rid of all cigarettes, ashtrays, matches, and lighters in your home, car, or work. Do not let people smoke in your home.  Review your past attempts to quit. Think about what worked and what did not. GET SUPPORT AND ENCOURAGEMENT You have a better chance of being successful if you have help. You can get support in many ways.  Tell your family, friends, and coworkers that you are going to quit and need their support. Ask them not to smoke around you.  Get individual, group, or telephone counseling and support. Programs are available at local hospitals and health centers. Call   your local health department for information about programs in your area.  Spiritual beliefs and practices may help some smokers quit.  Download a "quit meter" on your computer to keep track of quit statistics, such as how long you have gone without smoking, cigarettes not smoked, and money saved.  Get a self-help book about quitting smoking and staying off tobacco. LEARN NEW SKILLS AND BEHAVIORS  Distract yourself from urges to smoke. Talk to someone, go for a walk, or occupy your time with a task.  Change your normal routine. Take a different route to work.  Drink tea instead of coffee. Eat breakfast in a different place.  Reduce your stress. Take a hot bath, exercise, or read a book.  Plan something enjoyable to do every day. Reward yourself for not smoking.  Explore interactive web-based programs that specialize in helping you quit. GET MEDICINE AND USE IT CORRECTLY Medicines can help you stop smoking and decrease the urge to smoke. Combining medicine with the above behavioral methods and support can greatly increase your chances of successfully quitting smoking.  Nicotine replacement therapy helps deliver nicotine to your body without the negative effects and risks of smoking. Nicotine replacement therapy includes nicotine gum, lozenges, inhalers, nasal sprays, and skin patches. Some may be available over-the-counter and others require a prescription.  Antidepressant medicine helps people abstain from smoking, but how this works is unknown. This medicine is available by prescription.  Nicotinic receptor partial agonist medicine simulates the effect of nicotine in your brain. This medicine is available by prescription. Ask your health care provider for advice about which medicines to use and how to use them based on your health history. Your health care provider will tell you what side effects to look out for if you choose to be on a medicine or therapy. Carefully read the information on the package. Do not use any other product containing nicotine while using a nicotine replacement product.  RELAPSE OR DIFFICULT SITUATIONS Most relapses occur within the first 3 months after quitting. Do not be discouraged if you start smoking again. Remember, most people try several times before finally quitting. You may have symptoms of withdrawal because your body is used to nicotine. You may crave cigarettes, be irritable, feel very hungry, cough often, get headaches, or have difficulty concentrating. The withdrawal symptoms are only temporary. They are strongest  when you first quit, but they will go away within 10-14 days. To reduce the chances of relapse, try to:  Avoid drinking alcohol. Drinking lowers your chances of successfully quitting.  Reduce the amount of caffeine you consume. Once you quit smoking, the amount of caffeine in your body increases and can give you symptoms, such as a rapid heartbeat, sweating, and anxiety.  Avoid smokers because they can make you want to smoke.  Do not let weight gain distract you. Many smokers will gain weight when they quit, usually less than 10 pounds. Eat a healthy diet and stay active. You can always lose the weight gained after you quit.  Find ways to improve your mood other than smoking. FOR MORE INFORMATION  www.smokefree.gov  Document Released: 11/19/2001 Document Revised: 04/11/2014 Document Reviewed: 03/05/2012 ExitCare Patient Information 2015 [employer]. This information is not intended to replace advice given to you by your health care provider. Make sure you discuss any questions you have with your health care provider.  

## 2014-10-21 NOTE — Progress Notes (Addendum)
[facility]   Provider:  Larey Seat, M D  Referring Provider: [name], MD Primary Care Physician:  Reginia Forts, MD  Chief Complaint  Patient presents with  . NP Sleep    Rm 11, alone    HPI:  Beth Hughes is a 53 y.o. female and seen here as a referral  from Dr. Zannie Cove for a sleep evaluation.  Beth Hughes reports that she has been a restless sleeper for many years now and that his history does not sound. She feels not excessively daytime sleepy but rather fatigued. Her that it is documented many spells of activity at night and she feels that she is rather restless tossing turning and waking up frequently. This fragmented sleep has not been refreshing on restoring to her. She endorsed the depression scale at 2 points Epworth Sleepiness Scale at one point and fatigue severity scale at only 20 points. She states that she has been under a lot of stress.   Dr. Tamala Julian notes also that the patient has been a smoker and is still actively smoking, that she has lost 42 pounds weight on weight watchers, that she has hypertension number back spasms and insomnia as well as anxiety. She is also treated for hypothyroidism. Most nights Beth Hughes go to bed at 10 PM, it'll take 15-20 minutes to fall asleep- most nights she will take a sleep aid.  She takes muscle relaxants which also help her with sleep and she has a prescription for Xanax when necessary. She will be wake 2-3 times during the night, but is much more often restless as her Fit Bit  documented, she rises at 5.30 Am and wakes when her alarm goes off. Doesn't use the snooze button. May have less than 6 hours of nocturnal sleep, has 3 nocturias on average. She started having nocturia after hysterectomy.  She never naps and denies any irresistible urge to go back to bed.   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   No caffeine in AM, but at  work. She smokes a cigarette in AM. Her work place provides no daylight.   Review of Systems: Out of a complete 14 system review, the patient complains of only the following symptoms, and all other reviewed systems are negative. The patient endorsed fatigue, history of snoring, feeling hot hot flashes, snoring at night and aching muscles and paraspinal muscle cramps.  She also reports having birthmarks ( cafe au lait color, 2 on her neck). She snores, as witnessed by mother and son, and her brother has OSA and uses a CPAP  Epworth score  1 , Fatigue severity score 20  , depression score 2    History   Social History  . Marital Status: Single    Spouse Name: N/A    Number of Children: N/A  . Years of Education: N/A   Occupational History  . [employer] Freight forwarder    Social History Main Topics  . Smoking status: Current Every Day Smoker -- 1.00 packs/day for 30 years  . Smokeless tobacco: Never Used  . Alcohol Use: No  . Drug Use: No  . Sexual Activity: Not Currently     Comment: Last sexual activity 2004.   Other Topics Concern  . Not on file   Social History Narrative   Marital status: single; not dating.  Not interested.      Children;  one son (84); no grandchildren      Lives: with son.        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  [place] job.      Tobacco: 1 ppd x 35 years.        Alcohol: none; rarely      Drugs: none      Exercise: none;      Seatbelt:  100%      Guns: none      Sexual activity:  Last sexual activity ten years ago; no STDs.  Last STD testing five years ago.    Family History  Problem Relation Age of Onset  . Congestive Heart Failure Mother   . Diabetes Mother   . COPD Mother   . Atrial fibrillation Mother   . Macular degeneration Mother   . Heart disease Mother 3    CABG  . Stroke Mother 27    brain aneurysm in 56  . Aneurysm Mother   . Cancer Sister 6    breast cancer  . Alcohol abuse Brother   . COPD Brother   .  Congestive Heart Failure Brother   . Diabetes Brother     Past Medical History  Diagnosis Date  . Unspecified hypothyroidism   . Other and unspecified hyperlipidemia   . Insomnia, unspecified   . Tobacco use disorder   . Anxiety   . Abnormal LFTs (liver function tests)     Past Surgical History  Procedure Laterality Date  . Abdominal hysterectomy  12/09/1998    ovarian cysts; B ooophorectomy  . Colonoscopy w/ polypectomy  12/09/2008    colon polyps. I[facility].  Repeat in 5 years.    Current Outpatient Prescriptions  Medication Sig Dispense Refill  . ALPRAZolam (XANAX) 0.5 MG tablet Take 1 tablet (0.5 mg total) by mouth at bedtime as needed for anxiety. 30 tablet 5  . cyclobenzaprine (FLEXERIL) 10 MG tablet Take 1 tablet (10 mg total) by mouth 3 (three) times daily as needed. 60 tablet 5  . hydrochlorothiazide (HYDRODIURIL) 12.5 MG tablet Take 1 tablet (12.5 mg total) by mouth daily. 90 tablet 3  . levothyroxine (SYNTHROID, LEVOTHROID) 100 MCG tablet Take one tablet by mouth daily on every day, except on Tuesdays and Thursdays take 1 1/2 tablets. 105 tablet 3  . lisinopril-hydrochlorothiazide (PRINZIDE,ZESTORETIC) 10-12.5 MG per tablet Take 1 tablet by mouth daily. 90 tablet 3  . lovastatin (MEVACOR) 40 MG tablet Take 1 tablet (40 mg total) by mouth at bedtime. 90 tablet 3  . zolpidem (AMBIEN) 10 MG tablet Take 10 mg by mouth at bedtime as needed for sleep.     No current facility-administered medications for this visit.    Allergies as of 10/21/2014 - Review Complete 10/21/2014  Allergen Reaction Noted  . Penicillins  09/22/2012  . Pseudoephedrine  09/22/2012    Vitals: BP 126/81 mmHg  Pulse 82  Temp(Src) 97.9 F (36.6 C) (Oral)  Resp 12  Ht 5' 5.5" (1.664 m)  Wt 219 lb (99.338 kg)  BMI 35.88 kg/m2 Last Weight:  Wt Readings from Last 1 Encounters:  10/21/14 219 lb (99.338 kg)       Last Height:   Ht Readings from Last 1 Encounters:  10/21/14 5' 5.5" (1.664 m)     Physical exam:  General: The patient is awake, alert and appears not in acute distress. The patient is well groomed. Head: Normocephalic, atraumatic. Neck is supple. Mallampati 4  neck circumference:16. Nasal airflow  Not  restricted , TMJ is  Not  evident . Retrognathia is  Not seen.  Full upper and lower dentures.  Cardiovascular:  Regular rate and rhythm , without  murmurs or carotid bruit, and without distended neck veins. Respiratory: Lungs are clear to auscultation. Skin:  Without evidence of edema, or rash, appearing older than numeric age. Trunk: BMI is elevated . Neurologic exam : The patient is awake and alert, oriented to place and time.   Memory subjective described as intact. There is a normal attention span & concentration ability. Speech is fluent without  dysarthria, dysphonia or aphasia. Mood and affect are appropriate.  Cranial nerves: Pupils are equal and briskly reactive to light. Funduscopic exam without  evidence of pallor or edema.  Extraocular movements  in vertical and horizontal planes intact and without nystagmus. Visual fields by finger perimetry are intact. Hearing to finger rub intact.  Facial sensation intact to fine touch. Facial motor strength is symmetric and tongue  midline.  Motor exam:  Intact grip .   Sensory:  Fine touch, pinprick and vibration were tested in all extremities. Proprioception is  normal.  Coordination: Rapid alternating movements in the fingers/hands is without evidence of ataxia, dysmetria or tremor.  Gait and station: Patient walks without assistive device and is able unassisted to climb up to the exam table. Strength within normal limits. Stance is stable and normal. Tandem gait is fragmented. Patient has back pain during this exam,. Romberg testing is   negative.  Deep tendon reflexes: in the upper and lower extremities are symmetric and intact. Babinski maneuver response is downgoing.  Assessment:  After physical and  neurologic examination, review of laboratory studies, imaging, neurophysiology testing and pre-existing records, assessment is   1) patient with long standing tobacco use and witnessed snoring, waking with headaches.  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 Affecting circadian rhythm. 3) high grade Mallampati , BMI and larger neck size, all raising risk for OSA.  4)Nocturia may be a symptom of OSA, too.   The patient was advised of the nature of the diagnosed sleep disorder , the treatment options and risks for general a health and wellness arising from not treating the condition. Visit duration was 30 minutes.   Plan:  Treatment plan and additional workup : SPLIT study , AHI 20 and score at 4%. Co2  Needed. !!!  Asencion Partridge Madylin Fairbank MD  10/21/2014

## 2014-10-23 ENCOUNTER — Other Ambulatory Visit: Payer: Self-pay | Admitting: *Deleted

## 2014-10-23 ENCOUNTER — Encounter: Payer: Self-pay | Admitting: Family Medicine

## 2014-10-23 DIAGNOSIS — E66812 Obesity, class 2: Secondary | ICD-10-CM | POA: Insufficient documentation

## 2014-10-23 DIAGNOSIS — E669 Obesity, unspecified: Secondary | ICD-10-CM | POA: Insufficient documentation

## 2014-10-23 MED ORDER — LOVASTATIN 40 MG PO TABS: 40.0000 mg | ORAL_TABLET | Freq: Every day | ORAL | Status: DC

## 2014-10-23 MED ORDER — CYCLOBENZAPRINE HCL 10 MG PO TABS: 10.0000 mg | ORAL_TABLET | Freq: Three times a day (TID) | ORAL | Status: DC | PRN

## 2014-10-23 MED ORDER — LEVOTHYROXINE SODIUM 100 MCG PO TABS: ORAL_TABLET | ORAL | Status: DC

## 2014-11-17 ENCOUNTER — Other Ambulatory Visit: Payer: Self-pay | Admitting: Neurology

## 2014-11-17 DIAGNOSIS — R51 Headache: Secondary | ICD-10-CM

## 2014-11-17 DIAGNOSIS — R519 Headache, unspecified: Secondary | ICD-10-CM

## 2014-11-17 DIAGNOSIS — R0683 Snoring: Secondary | ICD-10-CM

## 2014-11-27 ENCOUNTER — Ambulatory Visit (INDEPENDENT_AMBULATORY_CARE_PROVIDER_SITE_OTHER): Payer: 59 | Admitting: Neurology

## 2014-11-27 DIAGNOSIS — G4730 Sleep apnea, unspecified: Secondary | ICD-10-CM

## 2014-11-27 DIAGNOSIS — R0683 Snoring: Secondary | ICD-10-CM

## 2014-11-27 DIAGNOSIS — R519 Headache, unspecified: Secondary | ICD-10-CM

## 2014-11-27 DIAGNOSIS — G4700 Insomnia, unspecified: Secondary | ICD-10-CM

## 2014-11-27 DIAGNOSIS — R51 Headache: Secondary | ICD-10-CM

## 2014-11-28 NOTE — Sleep Study (Signed)
Please see the scanned sleep study interpretation located in the Procedure tab within the chart review

## 2014-12-07 DIAGNOSIS — G4730 Sleep apnea, unspecified: Secondary | ICD-10-CM

## 2014-12-07 DIAGNOSIS — G4700 Insomnia, unspecified: Secondary | ICD-10-CM | POA: Insufficient documentation

## 2014-12-08 ENCOUNTER — Encounter: Payer: Self-pay | Admitting: Neurology

## 2014-12-12 ENCOUNTER — Telehealth: Payer: Self-pay | Admitting: *Deleted

## 2014-12-12 NOTE — Telephone Encounter (Signed)
Patient was contacted and provided the results of her sleep study which revealed a diagnosis of sleep apnea.  Patient was informed that a CPAP titration study had been recommended by Dr. Brett Fairy.  Due to the patient's insurance coverage of 80/20 she stated she could not afford to schedule the appointment at this time and stated she would contact the office back at a later time to schedule.  Dr. Reginia Forts was sent a copy of the results.

## 2015-02-19 ENCOUNTER — Other Ambulatory Visit: Payer: Self-pay | Admitting: Family Medicine

## 2015-02-21 ENCOUNTER — Telehealth: Payer: Self-pay

## 2015-02-21 DIAGNOSIS — E785 Hyperlipidemia, unspecified: Secondary | ICD-10-CM

## 2015-02-21 NOTE — Telephone Encounter (Signed)
Pt states she called in both her THYROID AND CHOL medicine and was told by [facility] she needed to call us. Please call pt at [phone]      CHOL MEDS GO TO [facility]  THYROID MEDS GO TO [facility]

## 2015-02-22 MED ORDER — LOVASTATIN 40 MG PO TABS: 40.0000 mg | ORAL_TABLET | Freq: Every day | ORAL | Status: DC

## 2015-02-22 NOTE — Telephone Encounter (Signed)
Sent in remaining RFs from [facility] for lovastatin written 10/23/14 to [facility] as req'd (it had been sent to [facility] originally). Called pt and she verified that [facility] already have the RFs left from the thyroid med sent in the same day.

## 2015-04-14 ENCOUNTER — Other Ambulatory Visit: Payer: Self-pay | Admitting: Family Medicine

## 2015-04-17 NOTE — Telephone Encounter (Signed)
Rx called into pharmacy for Xanax 0.5mg  po qhs #30 no refills on 04/17/15 at 17:11.

## 2015-04-19 ENCOUNTER — Ambulatory Visit (INDEPENDENT_AMBULATORY_CARE_PROVIDER_SITE_OTHER): Payer: 59 | Admitting: Family Medicine

## 2015-04-19 ENCOUNTER — Encounter: Payer: Self-pay | Admitting: Family Medicine

## 2015-04-19 VITALS — BP 120/81 | HR 96 | Temp 97.9°F | Resp 16 | Ht 64.75 in | Wt 214.8 lb

## 2015-04-19 DIAGNOSIS — F410 Panic disorder [episodic paroxysmal anxiety] without agoraphobia: Secondary | ICD-10-CM

## 2015-04-19 DIAGNOSIS — E7800 Pure hypercholesterolemia, unspecified: Secondary | ICD-10-CM

## 2015-04-19 DIAGNOSIS — F411 Generalized anxiety disorder: Secondary | ICD-10-CM | POA: Diagnosis not present

## 2015-04-19 DIAGNOSIS — E669 Obesity, unspecified: Secondary | ICD-10-CM

## 2015-04-19 DIAGNOSIS — E890 Postprocedural hypothyroidism: Secondary | ICD-10-CM

## 2015-04-19 DIAGNOSIS — R7302 Impaired glucose tolerance (oral): Secondary | ICD-10-CM

## 2015-04-19 MED ORDER — ALPRAZOLAM 0.5 MG PO TABS: ORAL_TABLET | ORAL | Status: DC

## 2015-04-19 NOTE — Progress Notes (Signed)
Subjective:    Patient ID: Beth Hughes, female    DOB: February 01, 1961, 54 y.o.   MRN: [id number]  HPI  Beth Hughes is a pleasant 54 year old female who presents today for 6 month follow up of hypertension, hyperlipidemia, hypothyroidism, and anxiety.  She is doing well today with no major complaints. She needs a medication refill of Xanax for anxiety. Says that she has been doing well at the increased dose of Xanax once daily. Has not had a panic attack since before she was seen here on 10/19/14. Work is stressful, but not more than normal.   Her blood pressure has been well controlled since starting Lisinopril. She doesn't check her blood pressure regularly at home, but when she does it runs about 120/80 mmHg. Denies headache, chest pain, lightheadedness. She has been doing weight watchers and has lost weight. She has not been exercising, but takes the stairs at work. Denies any difficulty breathing while walking around or up stairs.  Hypothyroidism has been well-controlled on Levothyroxine. Says she knows when her thyroid levels are off, but she has been stable. No side effects from the medications. 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 Does not use artificial tears at home.   She had a sleep study done in December 2015 and was diagnosed with mild sleep apnea. They wanted her to return to be tested with a CPAP machine, but she did not want to go and spend another $700 for the test if she wasn't planning on getting the CPAP machine. She is still snoring at night, but denies daytime drowsiness. She is sleeping well, and wakes up in the middle of the night to use the bathroom, but no more than usual. Feels rested when she wakes up.   She went to [facility] recently to inquire about a colonoscopy. She had a prior colonoscopy done in 2011, and was told she does not need to follow up and have another one done until 2021. Denies any  blood in her stool, diarrhea, constipation.  Review of Systems  Constitutional: Negative.   HENT: Negative.   Eyes: Positive for discharge (Watery-chronic from Grave's disease).  Respiratory: Positive for cough (Getting over a cold). Negative for shortness of breath and wheezing.   Cardiovascular: Negative for chest pain, palpitations and leg swelling.  Gastrointestinal: Negative.   Endocrine: Negative.   Genitourinary: Negative.   Musculoskeletal: Negative.   Skin: Negative.   Allergic/Immunologic: Negative for environmental allergies.  Neurological: Negative.   Psychiatric/Behavioral: Negative.       Objective:   Physical Exam  Constitutional: She is oriented to person, place, and time. She appears well-developed and well-nourished. No distress.  BP 120/81 mmHg  Pulse 96  Temp(Src) 97.9 F (36.6 C) (Oral)  Resp 16  Ht 5' 4.75" (1.645 m)  Wt 214 lb 12.8 oz (97.433 kg)  BMI 36.01 kg/m2  SpO2 95%  HENT:  Head: Normocephalic and atraumatic.  Right Ear: Hearing, tympanic membrane, external ear and ear canal normal.  Left Ear: Hearing, tympanic membrane, external ear and ear canal normal.  Nose: Nose normal.  Mouth/Throat: Uvula is midline, oropharynx is clear and moist and mucous membranes are normal. No oropharyngeal exudate, posterior oropharyngeal edema or posterior oropharyngeal erythema.  Eyes: Conjunctivae and EOM are normal. Pupils are equal, round, and reactive to light.  Neck: Normal range of motion. Neck supple. No thyromegaly present.  Cardiovascular: Normal rate, regular rhythm, S1 normal,  S2 normal and normal heart sounds.  Exam reveals no gallop and no friction rub.   No murmur heard. Pulmonary/Chest: Effort normal and breath sounds normal. She has no wheezes. She has no rhonchi. She has no rales.  Musculoskeletal: Normal range of motion. She exhibits no edema.  Lymphadenopathy:       Head (right side): No submental, no submandibular, no tonsillar, no  preauricular, no posterior auricular and no occipital adenopathy present.       Head (left side): No submental, no submandibular, no tonsillar, no preauricular, no posterior auricular and no occipital adenopathy present.    She has no cervical adenopathy.       Right: No supraclavicular adenopathy present.       Left: No supraclavicular adenopathy present.  Neurological: She is alert and oriented to person, place, and time.  Skin: Skin is warm and dry.  Psychiatric: She has a normal mood and affect. Her behavior is normal.      Assessment & Plan:  Beth Hughes is her for 6 month follow up of hypertension, hyperlipidemia, hypothyroidism, and anxiety. She is doing well with no complaints today. Plan is to follow up in 6 months for annual physical exam. Blood work sent to [facility].   1. Anxiety state Well-controlled on Xanax 0.5 mg daily. Denies panic attack. Still working through family stress and work stress.   2. Panic attack Improved. No new attacks.  3. Pure hypercholesterolemia Recheck lipid panel at [facility].   4. Obesity Continue to encourage weight loss and exercise. She is doing great. Still smoking and has no desire to quit right now.  5. Hypothyroidism, postradioiodine therapy Stable. Well-controlled on current dose of Levothyroxine 100 mcg.    6. Glucose intolerance (impaired glucose tolerance) Recheck lab work at [facility]. Continued to encourage healthy eating and exercise.  7. Essential hypertension, benign Stable. Well-controlled on current dose of Lisinopril-HCTZ 10-12.5 mg. Denies any cough or side effect from medication. Continue current medication regimen.

## 2015-06-14 ENCOUNTER — Other Ambulatory Visit: Payer: Self-pay | Admitting: Family Medicine

## 2015-06-14 NOTE — Telephone Encounter (Signed)
Pt is looking for refill of Flexeril, she just had an appt. 5-16 didn't see any note about it.  Can refill for 6 months?

## 2015-10-05 ENCOUNTER — Telehealth: Payer: Self-pay

## 2015-10-05 NOTE — Telephone Encounter (Signed)
Pt needs to talk with someone about an appeal she is needing done on the life [employer] benefits about her weight it is 3 lbs difference   [phone]

## 2015-10-06 ENCOUNTER — Telehealth: Payer: Self-pay

## 2015-10-06 NOTE — Telephone Encounter (Signed)
Dr. Smith, in your box.  

## 2015-10-06 NOTE — Telephone Encounter (Signed)
Patient brought in paperwork to be completed by Dr. Tamala Julian. I have placed the papers in the nurses box at 102. Please call the patient when these forms are completed. Thank you!

## 2015-10-08 ENCOUNTER — Other Ambulatory Visit: Payer: Self-pay | Admitting: Family Medicine

## 2015-10-10 NOTE — Telephone Encounter (Signed)
Left message for pt to call back  °

## 2015-10-18 ENCOUNTER — Encounter: Payer: 59 | Admitting: Family Medicine

## 2015-10-20 ENCOUNTER — Ambulatory Visit (INDEPENDENT_AMBULATORY_CARE_PROVIDER_SITE_OTHER): Payer: 59 | Admitting: Family Medicine

## 2015-10-20 ENCOUNTER — Encounter: Payer: Self-pay | Admitting: Family Medicine

## 2015-10-20 VITALS — BP 123/83 | HR 93 | Temp 98.1°F | Resp 16 | Ht 64.75 in | Wt 208.2 lb

## 2015-10-20 DIAGNOSIS — M5136 Other intervertebral disc degeneration, lumbar region: Secondary | ICD-10-CM

## 2015-10-20 DIAGNOSIS — E032 Hypothyroidism due to medicaments and other exogenous substances: Secondary | ICD-10-CM | POA: Diagnosis not present

## 2015-10-20 DIAGNOSIS — E785 Hyperlipidemia, unspecified: Secondary | ICD-10-CM

## 2015-10-20 DIAGNOSIS — R7302 Impaired glucose tolerance (oral): Secondary | ICD-10-CM

## 2015-10-20 DIAGNOSIS — M5030 Other cervical disc degeneration, unspecified cervical region: Secondary | ICD-10-CM

## 2015-10-20 DIAGNOSIS — F410 Panic disorder [episodic paroxysmal anxiety] without agoraphobia: Secondary | ICD-10-CM

## 2015-10-20 DIAGNOSIS — I10 Essential (primary) hypertension: Secondary | ICD-10-CM

## 2015-10-20 DIAGNOSIS — Z114 Encounter for screening for human immunodeficiency virus [HIV]: Secondary | ICD-10-CM | POA: Diagnosis not present

## 2015-10-20 DIAGNOSIS — G4730 Sleep apnea, unspecified: Secondary | ICD-10-CM

## 2015-10-20 DIAGNOSIS — F411 Generalized anxiety disorder: Secondary | ICD-10-CM

## 2015-10-20 DIAGNOSIS — L989 Disorder of the skin and subcutaneous tissue, unspecified: Secondary | ICD-10-CM

## 2015-10-20 DIAGNOSIS — Z1159 Encounter for screening for other viral diseases: Secondary | ICD-10-CM

## 2015-10-20 DIAGNOSIS — G4700 Insomnia, unspecified: Secondary | ICD-10-CM

## 2015-10-20 DIAGNOSIS — M51369 Other intervertebral disc degeneration, lumbar region without mention of lumbar back pain or lower extremity pain: Secondary | ICD-10-CM

## 2015-10-20 DIAGNOSIS — E890 Postprocedural hypothyroidism: Secondary | ICD-10-CM

## 2015-10-20 DIAGNOSIS — Z1231 Encounter for screening mammogram for malignant neoplasm of breast: Secondary | ICD-10-CM

## 2015-10-20 DIAGNOSIS — E7800 Pure hypercholesterolemia, unspecified: Secondary | ICD-10-CM

## 2015-10-20 DIAGNOSIS — E669 Obesity, unspecified: Secondary | ICD-10-CM | POA: Diagnosis not present

## 2015-10-20 DIAGNOSIS — Z0000 Encounter for general adult medical examination without abnormal findings: Secondary | ICD-10-CM | POA: Diagnosis not present

## 2015-10-20 DIAGNOSIS — Z803 Family history of malignant neoplasm of breast: Secondary | ICD-10-CM

## 2015-10-20 MED ORDER — LEVOTHYROXINE SODIUM 100 MCG PO TABS: ORAL_TABLET | ORAL | Status: DC

## 2015-10-20 MED ORDER — LISINOPRIL-HYDROCHLOROTHIAZIDE 10-12.5 MG PO TABS: 1.0000 | ORAL_TABLET | Freq: Every day | ORAL | Status: DC

## 2015-10-20 MED ORDER — LOVASTATIN 40 MG PO TABS: 40.0000 mg | ORAL_TABLET | Freq: Every day | ORAL | Status: DC

## 2015-10-20 MED ORDER — ALPRAZOLAM 0.5 MG PO TABS: ORAL_TABLET | ORAL | Status: DC

## 2015-10-20 MED ORDER — ASPIRIN EC 81 MG PO TBEC: 81.0000 mg | DELAYED_RELEASE_TABLET | Freq: Every day | ORAL | Status: AC

## 2015-10-20 MED ORDER — NYSTATIN 100000 UNIT/GM EX CREA
1.0000 | TOPICAL_CREAM | Freq: Two times a day (BID) | CUTANEOUS | Status: DC
Start: 2015-10-20 — End: 2017-11-10

## 2015-10-20 MED ORDER — CYCLOBENZAPRINE HCL 10 MG PO TABS: 10.0000 mg | ORAL_TABLET | Freq: Three times a day (TID) | ORAL | Status: DC | PRN

## 2015-10-20 NOTE — Addendum Note (Signed)
Addended by: [name] on: 10/20/2015 09:09 AM   Modules accepted: Orders

## 2015-10-20 NOTE — Progress Notes (Signed)
Subjective:    Patient ID: Beth Hughes, female    DOB: February 14, 1961, 54 y.o.   MRN: [id number]  10/20/2015  Annual Exam   HPI This 54 y.o. female presents for Complete Physical Examination.  Last physical:  08-16-13 Pap smear:  08-16-2013  WNL Mammogram:  09-26-2014 Colonoscopy:  2010 polyps; repeat due in 2020; +iftikhar; stool cards to [facility] in 03/2015; negative. TDAP:  2012 Pneumovax:  2012 Influenza:  Yearly; 10/10/2015 Eye exam:  Today at 1:00pm Dental exam:  dentures   Anxiety: taking Xanax qhs recently at night due to family stressors.  NO sadness.  DDD lumbar spine: taking Flexeril qhs.  DDD cervical spine: takes Flexeril qhs.  HTN: Patient reports good compliance with medication, good tolerance to medication, and good symptom control.  Not checking BP.  Takes BC daily.  Insomnia: taking Ambien sparingly. Bedtime at 10:00pm; wakes up 5:30am.  S/p sleep study.  Did not return for CPAP titration due to expense.  Mild OSA.  [employer].  Walking more.   Review of Systems  Constitutional: Negative for fever, chills, diaphoresis, activity change, appetite change, fatigue and unexpected weight change.  HENT: Negative for congestion, dental problem, drooling, ear discharge, ear pain, facial swelling, hearing loss, mouth sores, nosebleeds, postnasal drip, rhinorrhea, sinus pressure, sneezing, sore throat, tinnitus, trouble swallowing and voice change.   Eyes: Positive for discharge and visual disturbance. Negative for photophobia, pain, redness and itching.  Respiratory: Positive for cough. Negative for apnea, choking, chest tightness, shortness of breath, wheezing and stridor.   Cardiovascular: Negative for chest pain, palpitations and leg swelling.  Gastrointestinal: Negative for nausea, vomiting, abdominal pain, diarrhea, constipation, blood in stool, abdominal distention, anal bleeding and rectal pain.  Endocrine: Negative for cold intolerance, heat intolerance, polydipsia,  polyphagia and polyuria.  Genitourinary: Negative for dysuria, urgency, frequency, hematuria, flank pain, decreased urine volume, vaginal bleeding, vaginal discharge, enuresis, difficulty urinating, genital sores, vaginal pain, menstrual problem, pelvic pain and dyspareunia.  Musculoskeletal: Positive for myalgias, back pain, neck pain and neck stiffness. Negative for joint swelling, arthralgias and gait problem.  Skin: Positive for rash. Negative for color change, pallor and wound.  Allergic/Immunologic: Negative for environmental allergies, food allergies and immunocompromised state.  Neurological: Negative for dizziness, tremors, seizures, syncope, facial asymmetry, speech difficulty, weakness, light-headedness, numbness and headaches.  Hematological: Negative for adenopathy. Does not bruise/bleed easily.  Psychiatric/Behavioral: Negative for suicidal ideas, hallucinations, behavioral problems, confusion, sleep disturbance, self-injury, dysphoric mood, decreased concentration and agitation. The patient is not nervous/anxious and is not hyperactive.     Past Medical History  Diagnosis Date  . Unspecified hypothyroidism   . Other and unspecified hyperlipidemia   . Insomnia, unspecified   . Tobacco use disorder   . Anxiety   . Abnormal LFTs (liver function tests)   . Snoring 10/21/2014   Past Surgical History  Procedure Laterality Date  . Colonoscopy w/ polypectomy  12/09/2008    colon polyps. Iftikhar.  Repeat in 5 years.  . Abdominal hysterectomy  12/09/1998    ovarian cysts; B ooophorectomy.  No  cervical dysplasia.  [place].   Allergies  Allergen Reactions  . Penicillins     Blacked out  . Pseudoephedrine     Heart races, seizures    Social History   Social History  . Marital Status: Single    Spouse Name: N/A  . Number of Children: 1  . Years of Education: N/A   Occupational History  . [employer]ight forwarder    Social  History Main Topics  . Smoking status: Current Every Day  Smoker -- 1.00 packs/day for 40 years  . Smokeless tobacco: Never Used  . Alcohol Use: 0.0 oz/week    0 Standard drinks or equivalent per week     Comment: occasionally mixed drink  . Drug Use: No  . Sexual Activity: Not Currently     Comment: Last sexual activity 2004.   Other Topics Concern  . Not on file   Social History Narrative   Marital status: single; not dating.  Not interested.      Children; one son (78); no grandchildren      Lives: with son.        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      Tobacco: 1 ppd x 40 years.        Alcohol: none; rarely      Drugs: none      Exercise: none; walking 6500 steps per day.      Seatbelt:  100%      Guns: none      Sexual activity:  Not sexual activity:  Last sexual activity over ten years ago; no STDs.  Last STD testing five years ago.   Family History  Problem Relation Age of Onset  . Congestive Heart Failure Mother   . Diabetes Mother   . COPD Mother   . Atrial fibrillation Mother   . Macular degeneration Mother   . Heart disease Mother 63    CABG  . Stroke Mother 29    brain aneurysm in 15  . Aneurysm Mother   . Cancer Sister 29    breast cancer; genetic testing negative.  . Alcohol abuse Brother   . COPD Brother   . Congestive Heart Failure Brother   . Diabetes Brother   . Hepatitis C Brother   . COPD Brother   . Atrial fibrillation Brother        Objective:    BP 123/83 mmHg  Pulse 93  Temp(Src) 98.1 F (36.7 C) (Oral)  Resp 16  Ht 5' 4.75" (1.645 m)  Wt 208 lb 3.2 oz (94.439 kg)  BMI 34.90 kg/m2 Physical Exam  Constitutional: She is oriented to person, place, and time. She appears well-developed and well-nourished. No distress.  HENT:  Head: Normocephalic and atraumatic.  Right Ear: External ear normal.  Left Ear: External ear normal.  Nose: Nose normal.  Mouth/Throat: Oropharynx is clear and moist.  Eyes: Conjunctivae and EOM are normal. Pupils are equal, round,  and reactive to light.  Neck: Normal range of motion and full passive range of motion without pain. Neck supple. No JVD present. Carotid bruit is not present. No thyromegaly present.  Cardiovascular: Normal rate, regular rhythm and normal heart sounds.  Exam reveals no gallop and no friction rub.   No murmur heard. Pulmonary/Chest: Effort normal and breath sounds normal. She has no wheezes. She has no rales. Right breast exhibits no inverted nipple, no mass, no nipple discharge, no skin change and no tenderness. Left breast exhibits no inverted nipple, no mass, no nipple discharge, no skin change and no tenderness. Breasts are symmetrical.  +erythematous scaling rash under B breasts; milder rash inner thighs B.   Abdominal: Soft. Bowel sounds are normal. She exhibits no distension and no mass. There is no tenderness. There is no rebound and no guarding.  Genitourinary:  Patient declined pelvic exam.  Musculoskeletal:       Right shoulder:  Normal.       Left shoulder: Normal.       Cervical back: Normal.  Lymphadenopathy:    She has no cervical adenopathy.  Neurological: She is alert and oriented to person, place, and time. She has normal reflexes. No cranial nerve deficit. She exhibits normal muscle tone. Coordination normal.  Skin: Skin is warm and dry. Rash noted. She is not diaphoretic. No erythema. No pallor.  +diffuse sun related changes torso especially back and arms.  Psychiatric: She has a normal mood and affect. Her behavior is normal. Judgment and thought content normal.  Nursing note and vitals reviewed.  Results for orders placed or performed in visit on 10/19/14  CBC with Differential  Result Value Ref Range   WBC 6.7 4.0 - 10.5 K/uL   RBC 5.13 (H) 3.87 - 5.11 MIL/uL   Hemoglobin 15.9 (H) 12.0 - 15.0 g/dL   HCT 47.1 (H) 36.0 - 46.0 %   MCV 91.8 78.0 - 100.0 fL   MCH 31.0 26.0 - 34.0 pg   MCHC 33.8 30.0 - 36.0 g/dL   RDW 13.6 11.5 - 15.5 %   Platelets 244 150 - 400 K/uL    Neutrophils Relative % 52 43 - 77 %   Neutro Abs 3.5 1.7 - 7.7 K/uL   Lymphocytes Relative 39 12 - 46 %   Lymphs Abs 2.6 0.7 - 4.0 K/uL   Monocytes Relative 7 3 - 12 %   Monocytes Absolute 0.5 0.1 - 1.0 K/uL   Eosinophils Relative 2 0 - 5 %   Eosinophils Absolute 0.1 0.0 - 0.7 K/uL   Basophils Relative 0 0 - 1 %   Basophils Absolute 0.0 0.0 - 0.1 K/uL   Smear Review Criteria for review not met   Comprehensive metabolic panel  Result Value Ref Range   Sodium 140 135 - 145 mEq/L   Potassium 4.5 3.5 - 5.3 mEq/L   Chloride 105 96 - 112 mEq/L   CO2 24 19 - 32 mEq/L   Glucose, Bld 85 70 - 99 mg/dL   BUN 13 6 - 23 mg/dL   Creat 0.78 0.50 - 1.10 mg/dL   Total Bilirubin 0.4 0.2 - 1.2 mg/dL   Alkaline Phosphatase 68 39 - 117 U/L   AST 18 0 - 37 U/L   ALT 18 0 - 35 U/L   Total Protein 7.1 6.0 - 8.3 g/dL   Albumin 4.1 3.5 - 5.2 g/dL   Calcium 9.4 8.4 - 10.5 mg/dL  Hemoglobin A1c  Result Value Ref Range   Hgb A1c MFr Bld 5.4 <5.7 %   Mean Plasma Glucose 108 <117 mg/dL  Lipid panel  Result Value Ref Range   Cholesterol 147 0 - 200 mg/dL   Triglycerides 135 <150 mg/dL   HDL 40 >39 mg/dL   Total CHOL/HDL Ratio 3.7 Ratio   VLDL 27 0 - 40 mg/dL   LDL Cholesterol 80 0 - 99 mg/dL  T4, free  Result Value Ref Range   Free T4 1.34 0.80 - 1.80 ng/dL  TSH  Result Value Ref Range   TSH 1.381 0.350 - 4.500 uIU/mL  POCT urinalysis dipstick  Result Value Ref Range   Color, UA yellow    Clarity, UA clear    Glucose, UA neg    Bilirubin, UA neg    Ketones, UA neg    Spec Grav, UA 1.015    Blood, UA trace    pH, UA 6.5    Protein, UA  neg    Urobilinogen, UA 0.2    Nitrite, UA neg    Leukocytes, UA Negative        Assessment & Plan:   1. Routine physical examination   2. Pure hypercholesterolemia   3. Hypothyroidism, postradioiodine therapy   4. Panic attack   5. Glucose intolerance (impaired glucose tolerance)   6. Essential hypertension, benign   7. Insomnia with sleep apnea     8. Obesity   9. Anxiety state   10. Hypothyroidism due to medication   11. Hyperlipidemia   12. Essential hypertension   13. Screening for HIV (human immunodeficiency virus)   14. Need for hepatitis C screening test   15. Encounter for screening mammogram for breast cancer   16. Family history of breast cancer in sister      1. Complete Physical Examination: anticipatory guidance --- continued weight loss, exercise, smoking cessation.  Pap smear UTD; refer for mammogram.  Colonoscopy UTD. Immunizations UTD. 2.  Hypercholesterolemia: controlled; obtain labs; refill provided. 3.  HTN: controlled; obtain labs; refills provided. EKG 2015. 4.  Hypothyroidism s/p ablation from [place]: stable; obtain labs; refills provided. 5.  Panic attacks/anxiety/insomnia: refills of Xanax provided; using Ambien sparingly. 6.  DDD lumbar spine and cervical spine: controlled; refill of Flexeril provided. 7.  Screening HIV; obtain labs. 8. Screening Hepatitis C: obtain labs. 9.  Candidiasis breast: New; rx for Nystatin. 26. Family history of breast cancer: refer for mammogram; sister age 95 with breast cancer; s/p genetic testing negative. 11.  Skin changes: diffuse sun related skin changes; refer to derm.  Orders Placed This Encounter  Procedures  . MM Digital Screening    Standing Status: Future     Number of Occurrences:      Standing Expiration Date: 12/19/2016    Order Specific Question:  Reason for Exam (SYMPTOM  OR DIAGNOSIS REQUIRED)    Answer:  annual screening; sister breast cancer age 28    Order Specific Question:  Is the patient pregnant?    Answer:  No    Order Specific Question:  Preferred imaging location?    Answer:  [facility]  . CBC with Differential/Platelet  . Comprehensive metabolic panel    Order Specific Question:  Has the patient fasted?    Answer:  Yes  . Hemoglobin A1c  . Lipid panel    Order Specific Question:  Has the patient fasted?    Answer:  Yes  . TSH   . T4, free  . HIV antibody  . Hepatitis C antibody  . POCT urinalysis dipstick   Meds ordered this encounter  Medications  . cyclobenzaprine (FLEXERIL) 10 MG tablet    Sig: Take 1 tablet (10 mg total) by mouth 3 (three) times daily as needed.    Dispense:  90 tablet    Refill:  1  . levothyroxine (SYNTHROID, LEVOTHROID) 100 MCG tablet    Sig: Take one tablet by mouth daily on every day, except on Tuesdays and Thursdays take 1 1/2 tablets.    Dispense:  105 tablet    Refill:  3  . lisinopril-hydrochlorothiazide (PRINZIDE,ZESTORETIC) 10-12.5 MG tablet    Sig: Take 1 tablet by mouth daily.    Dispense:  90 tablet    Refill:  3  . ALPRAZolam (XANAX) 0.5 MG tablet    Sig: TAKE ONE TABLET BY MOUTH AT BEDTIME AS NEEDED FOR ANXIETY    Dispense:  30 tablet    Refill:  5  . lovastatin (MEVACOR) 40  MG tablet    Sig: Take 1 tablet (40 mg total) by mouth at bedtime.    Dispense:  90 tablet    Refill:  3  . nystatin cream (MYCOSTATIN)    Sig: Apply 1 application topically 2 (two) times daily.    Dispense:  30 g    Refill:  5    Return in about 6 months (around 04/18/2016) for recheck blood pressure, cholesterol.    Riko Lumsden Elayne Guerin, M.D. [facility] [address] [place], [place]  [address] [phone] phone [phone] fax

## 2015-10-20 NOTE — Patient Instructions (Signed)

## 2015-10-20 NOTE — Addendum Note (Signed)
Addended by: Constance Goltz on: 10/20/2015 09:09 AM   Modules accepted: Miquel Dunn

## 2015-10-21 LAB — CBC WITH DIFFERENTIAL/PLATELET
BASOS: 0 %
Basophils Absolute: 0 10*3/uL (ref 0.0–0.2)
EOS (ABSOLUTE): 0.1 10*3/uL (ref 0.0–0.4)
Eos: 2 %
Hematocrit: 45.8 % (ref 34.0–46.6)
Hemoglobin: 15.6 g/dL (ref 11.1–15.9)
IMMATURE GRANS (ABS): 0 10*3/uL (ref 0.0–0.1)
Immature Granulocytes: 0 %
LYMPHS ABS: 2.1 10*3/uL (ref 0.7–3.1)
Lymphs: 40 %
MCH: 31 pg (ref 26.6–33.0)
MCHC: 34.1 g/dL (ref 31.5–35.7)
MCV: 91 fL (ref 79–97)
MONOS ABS: 0.4 10*3/uL (ref 0.1–0.9)
Monocytes: 7 %
NEUTROS ABS: 2.6 10*3/uL (ref 1.4–7.0)
Neutrophils: 51 %
PLATELETS: 258 10*3/uL (ref 150–379)
RBC: 5.03 x10E6/uL (ref 3.77–5.28)
RDW: 13 % (ref 12.3–15.4)
WBC: 5.1 10*3/uL (ref 3.4–10.8)

## 2015-10-21 LAB — COMPREHENSIVE METABOLIC PANEL
A/G RATIO: 1.6 (ref 1.1–2.5)
ALT: 19 IU/L (ref 0–32)
AST: 21 IU/L (ref 0–40)
Albumin: 4.4 g/dL (ref 3.5–5.5)
Alkaline Phosphatase: 66 IU/L (ref 39–117)
BILIRUBIN TOTAL: 0.2 mg/dL (ref 0.0–1.2)
BUN/Creatinine Ratio: 20 (ref 9–23)
BUN: 14 mg/dL (ref 6–24)
CHLORIDE: 103 mmol/L (ref 97–106)
CO2: 22 mmol/L (ref 18–29)
Calcium: 9.2 mg/dL (ref 8.7–10.2)
Creatinine, Ser: 0.7 mg/dL (ref 0.57–1.00)
GFR calc non Af Amer: 97 mL/min/{1.73_m2} (ref >59)
GFR, EST AFRICAN AMERICAN: 112 mL/min/{1.73_m2} (ref >59)
Globulin, Total: 2.8 g/dL (ref 1.5–4.5)
Glucose: 88 mg/dL (ref 65–99)
POTASSIUM: 4.4 mmol/L (ref 3.5–5.2)
Sodium: 141 mmol/L (ref 136–144)
TOTAL PROTEIN: 7.2 g/dL (ref 6.0–8.5)

## 2015-10-21 LAB — LIPID PANEL
CHOLESTEROL TOTAL: 148 mg/dL (ref 100–199)
Chol/HDL Ratio: 3.7 ratio units (ref 0.0–4.4)
HDL: 40 mg/dL (ref >39)
LDL Calculated: 89 mg/dL (ref 0–99)
Triglycerides: 95 mg/dL (ref 0–149)
VLDL Cholesterol Cal: 19 mg/dL (ref 5–40)

## 2015-10-21 LAB — T4, FREE: FREE T4: 1.69 ng/dL (ref 0.82–1.77)

## 2015-10-21 LAB — HEMOGLOBIN A1C
Est. average glucose Bld gHb Est-mCnc: 111 mg/dL
Hgb A1c MFr Bld: 5.5 % (ref 4.8–5.6)

## 2015-10-21 LAB — HEPATITIS C ANTIBODY

## 2015-10-21 LAB — TSH: TSH: 0.478 u[IU]/mL (ref 0.450–4.500)

## 2015-10-21 LAB — HIV ANTIBODY (ROUTINE TESTING W REFLEX): HIV SCREEN 4TH GENERATION: NONREACTIVE

## 2015-10-22 NOTE — Telephone Encounter (Signed)
Form reviewed with patient during visit on 10/20/15.

## 2015-10-23 ENCOUNTER — Other Ambulatory Visit: Payer: Self-pay

## 2015-10-23 DIAGNOSIS — Z1231 Encounter for screening mammogram for malignant neoplasm of breast: Secondary | ICD-10-CM

## 2015-10-26 ENCOUNTER — Encounter: Payer: Self-pay | Admitting: Family Medicine

## 2015-11-07 ENCOUNTER — Ambulatory Visit: Admission: RE | Admit: 2015-11-07 | Payer: Self-pay | Source: Ambulatory Visit

## 2016-03-04 ENCOUNTER — Other Ambulatory Visit: Payer: Self-pay | Admitting: Family Medicine

## 2016-04-19 ENCOUNTER — Ambulatory Visit: Payer: 59 | Admitting: Family Medicine

## 2016-04-23 ENCOUNTER — Ambulatory Visit (INDEPENDENT_AMBULATORY_CARE_PROVIDER_SITE_OTHER): Payer: 59 | Admitting: Family Medicine

## 2016-04-23 ENCOUNTER — Encounter: Payer: Self-pay | Admitting: Family Medicine

## 2016-04-23 ENCOUNTER — Ambulatory Visit (INDEPENDENT_AMBULATORY_CARE_PROVIDER_SITE_OTHER): Payer: 59

## 2016-04-23 VITALS — BP 106/82 | HR 104 | Temp 98.0°F | Resp 16 | Ht 65.0 in | Wt 213.0 lb

## 2016-04-23 DIAGNOSIS — E038 Other specified hypothyroidism: Secondary | ICD-10-CM | POA: Diagnosis not present

## 2016-04-23 DIAGNOSIS — Z131 Encounter for screening for diabetes mellitus: Secondary | ICD-10-CM

## 2016-04-23 DIAGNOSIS — M542 Cervicalgia: Secondary | ICD-10-CM

## 2016-04-23 DIAGNOSIS — M5030 Other cervical disc degeneration, unspecified cervical region: Secondary | ICD-10-CM

## 2016-04-23 DIAGNOSIS — E7800 Pure hypercholesterolemia, unspecified: Secondary | ICD-10-CM

## 2016-04-23 DIAGNOSIS — E034 Atrophy of thyroid (acquired): Secondary | ICD-10-CM

## 2016-04-23 DIAGNOSIS — I10 Essential (primary) hypertension: Secondary | ICD-10-CM | POA: Diagnosis not present

## 2016-04-23 NOTE — Progress Notes (Signed)
Subjective:    Patient ID: Beth Hughes, female    DOB: November 04, 1961, 55 y.o.   MRN: [id number]  04/23/2016  Hypertension and Hyperlipidemia   HPI This 55 y.o. female presents for six month follow-up:   1.  HTN: Patient reports good compliance with medication, good tolerance to medication, and good symptom control.  Does not check BP at home.  2.  Skin changes: s/p derm evaluation.  No follow-up until one year.  [place].   3.  Hyperlipidemia: Patient reports good compliance with medication, good tolerance to medication, and good symptom control.    4.  Hypothyroidism: Patient reports good compliance with medication, good tolerance to medication, and good symptom control.    5.  Breast cancer screening: no mammogram yet.    6.  Anxiety: stressful month this month; usually has a death this month.  Emotionally struggling. Work is stressful and unchanged.  Taking Xanax qhs.  No Ambien.  7. Cervical DDD; taking Flexeril qhs; will falre intermittently. i   Review of Systems  Constitutional: Negative for fever, chills, diaphoresis and fatigue.  Eyes: Negative for visual disturbance.  Respiratory: Negative for cough and shortness of breath.   Cardiovascular: Negative for chest pain, palpitations and leg swelling.  Gastrointestinal: Negative for nausea, vomiting, abdominal pain, diarrhea and constipation.  Endocrine: Negative for cold intolerance, heat intolerance, polydipsia, polyphagia and polyuria.  Neurological: Negative for dizziness, tremors, seizures, syncope, facial asymmetry, speech difficulty, weakness, light-headedness, numbness and headaches.    Past Medical History  Diagnosis Date  . Unspecified hypothyroidism   . Other and unspecified hyperlipidemia   . Insomnia, unspecified   . Tobacco use disorder   . Anxiety   . Abnormal LFTs (liver function tests)   . Snoring 10/21/2014  . Hypertension    Past Surgical History  Procedure Laterality Date  . Colonoscopy w/  polypectomy  12/09/2008    colon polyps. Iftikhar.  Repeat in 5 years.  . Abdominal hysterectomy  12/09/1998    ovarian cysts; B ooophorectomy.  No  cervical dysplasia.  [place].   Allergies  Allergen Reactions  . Penicillins     Blacked out  . Pseudoephedrine     Heart races, seizures    Social History   Social History  . Marital Status: Single    Spouse Name: N/A  . Number of Children: 1  . Years of Education: N/A   Occupational History  . [employer] Freight forwarder    Social History Main Topics  . Smoking status: Current Every Day Smoker -- 1.00 packs/day for 40 years  . Smokeless tobacco: Never Used  . Alcohol Use: 0.0 oz/week    0 Standard drinks or equivalent per week     Comment: occasionally mixed drink  . Drug Use: No  . Sexual Activity: Not Currently     Comment: Last sexual activity 2004.   Other Topics Concern  . Not on file   Social History Narrative   Marital status: single; not dating.  Not interested.      Children; one son (23); no grandchildren      Lives: with son.        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      Tobacco: 1 ppd x 40 years.        Alcohol: none; rarely      Drugs: none      Exercise: none; walking 6500 steps per day.      Seatbelt:  100%      Guns: none      Sexual activity:  Not sexual activity:  Last sexual activity over ten years ago; no STDs.  Last STD testing five years ago.   Family History  Problem Relation Age of Onset  . Congestive Heart Failure Mother   . Diabetes Mother   . COPD Mother   . Atrial fibrillation Mother   . Macular degeneration Mother   . Heart disease Mother 47    CABG  . Stroke Mother 63    brain aneurysm in 50  . Aneurysm Mother   . Breast cancer Sister 102    genetic testing negative  . Alcohol abuse Brother   . COPD Brother   . Congestive Heart Failure Brother   . Diabetes Brother   . Hepatitis C Brother   . COPD Brother   . Atrial fibrillation Brother          Objective:    BP 106/82 mmHg  Pulse 104  Temp(Src) 98 F (36.7 C) (Oral)  Resp 16  Ht 5\' 5"  (1.651 m)  Wt 213 lb (96.616 kg)  BMI 35.44 kg/m2  SpO2 95% Physical Exam  Constitutional: She is oriented to person, place, and time. She appears well-developed and well-nourished. No distress.  HENT:  Head: Normocephalic and atraumatic.  Right Ear: External ear normal.  Left Ear: External ear normal.  Nose: Nose normal.  Mouth/Throat: Oropharynx is clear and moist.  Eyes: Conjunctivae and EOM are normal. Pupils are equal, round, and reactive to light.  Neck: Normal range of motion. Neck supple. Carotid bruit is not present. No thyromegaly present.  Cardiovascular: Normal rate, regular rhythm, normal heart sounds and intact distal pulses.  Exam reveals no gallop and no friction rub.   No murmur heard. Pulmonary/Chest: Effort normal and breath sounds normal. She has no wheezes. She has no rales.  Abdominal: Soft. Bowel sounds are normal. She exhibits no distension and no mass. There is no tenderness. There is no rebound and no guarding.  Lymphadenopathy:    She has no cervical adenopathy.  Neurological: She is alert and oriented to person, place, and time. No cranial nerve deficit.  Skin: Skin is warm and dry. No rash noted. She is not diaphoretic. No erythema. No pallor.  Psychiatric: She has a normal mood and affect. Her behavior is normal.        Assessment & Plan:   1. Essential hypertension, benign   2. Pure hypercholesterolemia   3. Screening for diabetes mellitus   4. Hypothyroidism due to acquired atrophy of thyroid   5. Neck pain   6. Degenerative disc disease, cervical     Orders Placed This Encounter  Procedures  . DG Cervical Spine Complete    Standing Status: Future     Number of Occurrences: 1     Standing Expiration Date: 04/23/2017    Order Specific Question:  Reason for Exam (SYMPTOM  OR DIAGNOSIS REQUIRED)    Answer:  chronic neck pain    Order Specific  Question:  Is the patient pregnant?    Answer:  No    Order Specific Question:  Preferred imaging location?    Answer:  [facility]  . Comprehensive metabolic panel    Order Specific Question:  Has the patient fasted?    Answer:  Yes  . Hemoglobin A1c  . Lipid panel    Order Specific Question:  Has the patient fasted?    Answer:  Yes  . TSH  .  T4, free  . CBC with Differential/Platelet   No orders of the defined types were placed in this encounter.    Return in about 6 months (around 10/24/2016) for complete physical examiniation.    Beth Hughes, M.D. [facility] [address] [place], [place]  [address] [phone] phone [phone] fax

## 2016-04-23 NOTE — Patient Instructions (Addendum)
We recommend that you schedule a mammogram for breast cancer screening. Typically, you do not need a referral to do this. Please contact a local imaging center to schedule your mammogram.  [facility] - [phone]  *ask for the Radiology Department [facility] ([place]) - [phone] or [phone]  [facility] - [phone] [place] [phone] [facility]  - [phone]  *ask for the [facility] - [phone]  *ask for the Radiology Department [facility] - [phone]  *ask for the [place] - [phone]      IF you received an x-ray today, you will receive an invoice from [facility]. Please contact [facility] at [phone] with questions or concerns regarding your invoice.   IF you received labwork today, you will receive an invoice from [employer]. Please contact [facility] at [phone] with questions or concerns regarding your invoice.   Our billing staff will not be able to assist you with questions regarding bills from these companies.  You will be contacted with the lab results as soon as they are available. The fastest way to get your results is to activate your My Chart account. Instructions are located on the last page of this paperwork. If you have not heard from [place] regarding the results in 2 weeks, please contact this office.       Cervical Strain and Sprain With Rehab Cervical strain and sprain are injuries that commonly occur with "whiplash" injuries. Whiplash occurs when the neck is forcefully whipped backward or forward, such as during a motor vehicle accident or during contact sports. The muscles, ligaments, tendons, discs, and nerves of the neck are susceptible to injury when this occurs. RISK FACTORS Risk of having a whiplash injury increases  if:  Osteoarthritis of the spine.  Situations that make head or neck accidents or trauma more likely.  High-risk sports (football, rugby, wrestling, hockey, auto racing, gymnastics, diving, contact karate, or boxing).  Poor strength and flexibility of the neck.  Previous neck injury.  Poor tackling technique.  Improperly fitted or padded equipment. SYMPTOMS   Pain or stiffness in the front or back of neck or both.  Symptoms may present immediately or up to 24 hours after injury.  Dizziness, headache, nausea, and vomiting.  Muscle spasm with soreness and stiffness in the neck.  Tenderness and swelling at the injury site. PREVENTION  Learn and use proper technique (avoid tackling with the head, spearing, and head-butting; use proper falling techniques to avoid landing on the head).  Warm up and stretch properly before activity.  Maintain physical fitness:  Strength, flexibility, and endurance.  Cardiovascular fitness.  Wear properly fitted and padded protective equipment, such as padded soft collars, for participation in contact sports. PROGNOSIS  Recovery from cervical strain and sprain injuries is dependent on the extent of the injury. These injuries are usually curable in 1 week to 3 months with appropriate treatment.  RELATED COMPLICATIONS   Temporary numbness and weakness may occur if the nerve roots are damaged, and this may persist until the nerve has completely healed.  Chronic pain due to frequent recurrence of symptoms.  Prolonged healing, especially if activity is resumed too soon (before complete recovery). TREATMENT  Treatment initially involves the use of ice and medication to help reduce pain and inflammation. It is also important to perform strengthening and stretching exercises and modify activities that worsen symptoms so  the injury does not get worse. These exercises may be performed at home or with a therapist. For patients who experience severe  symptoms, a soft, padded collar may be recommended to be worn around the neck.  Improving your posture may help reduce symptoms. Posture improvement includes pulling your chin and abdomen in while sitting or standing. If you are sitting, sit in a firm chair with your buttocks against the back of the chair. While sleeping, try replacing your pillow with a small towel rolled to 2 inches in diameter, or use a cervical pillow or soft cervical collar. Poor sleeping positions delay healing.  For patients with nerve root damage, which causes numbness or weakness, the use of a cervical traction apparatus may be recommended. Surgery is rarely necessary for these injuries. However, cervical strain and sprains that are present at birth (congenital) may require surgery. MEDICATION   If pain medication is necessary, nonsteroidal anti-inflammatory medications, such as aspirin and ibuprofen, or other minor pain relievers, such as acetaminophen, are often recommended.  Do not take pain medication for 7 days before surgery.  Prescription pain relievers may be given if deemed necessary by your caregiver. Use only as directed and only as much as you need. HEAT AND COLD:   Cold treatment (icing) relieves pain and reduces inflammation. Cold treatment should be applied for 10 to 15 minutes every 2 to 3 hours for inflammation and pain and immediately after any activity that aggravates your symptoms. Use ice packs or an ice massage.  Heat treatment may be used prior to performing the stretching and strengthening activities prescribed by your caregiver, physical therapist, or athletic trainer. Use a heat pack or a warm soak. SEEK MEDICAL CARE IF:   Symptoms get worse or do not improve in 2 weeks despite treatment.  New, unexplained symptoms develop (drugs used in treatment may produce side effects). EXERCISES RANGE OF MOTION (ROM) AND STRETCHING EXERCISES - Cervical Strain and Sprain These exercises may help you when  beginning to rehabilitate your injury. In order to successfully resolve your symptoms, you must improve your posture. These exercises are designed to help reduce the forward-head and rounded-shoulder posture which contributes to this condition. Your symptoms may resolve with or without further involvement from your physician, physical therapist or athletic trainer. While completing these exercises, remember:   Restoring tissue flexibility helps normal motion to return to the joints. This allows healthier, less painful movement and activity.  An effective stretch should be held for at least 20 seconds, although you may need to begin with shorter hold times for comfort.  A stretch should never be painful. You should only feel a gentle lengthening or release in the stretched tissue. STRETCH- Axial Extensors  Lie on your back on the floor. You may bend your knees for comfort. Place a rolled-up hand towel or dish towel, about 2 inches in diameter, under the part of your head that makes contact with the floor.  Gently tuck your chin, as if trying to make a "double chin," until you feel a gentle stretch at the base of your head.  Hold __________ seconds. Repeat __________ times. Complete this exercise __________ times per day.  STRETCH - Axial Extension   Stand or sit on a firm surface. Assume a good posture: chest up, shoulders drawn back, abdominal muscles slightly tense, knees unlocked (if standing) and feet hip width apart.  Slowly retract your chin so your head slides back and your chin slightly lowers. Continue to look straight ahead.  You should feel a gentle stretch in the back of your head. Be certain not to feel an aggressive stretch since this can cause headaches later.  Hold for __________ seconds. Repeat __________ times. Complete this exercise __________ times per day. STRETCH - Cervical Side Bend   Stand or sit on a firm surface. Assume a good posture: chest up, shoulders drawn  back, abdominal muscles slightly tense, knees unlocked (if standing) and feet hip width apart.  Without letting your nose or shoulders move, slowly tip your right / left ear to your shoulder until your feel a gentle stretch in the muscles on the opposite side of your neck.  Hold __________ seconds. Repeat __________ times. Complete this exercise __________ times per day. STRETCH - Cervical Rotators   Stand or sit on a firm surface. Assume a good posture: chest up, shoulders drawn back, abdominal muscles slightly tense, knees unlocked (if standing) and feet hip width apart.  Keeping your eyes level with the ground, slowly turn your head until you feel a gentle stretch along the back and opposite side of your neck.  Hold __________ seconds. Repeat __________ times. Complete this exercise __________ times per day. RANGE OF MOTION - Neck Circles   Stand or sit on a firm surface. Assume a good posture: chest up, shoulders drawn back, abdominal muscles slightly tense, knees unlocked (if standing) and feet hip width apart.  Gently roll your head down and around from the back of one shoulder to the back of the other. The motion should never be forced or painful.  Repeat the motion 10-20 times, or until you feel the neck muscles relax and loosen. Repeat __________ times. Complete the exercise __________ times per day. STRENGTHENING EXERCISES - Cervical Strain and Sprain These exercises may help you when beginning to rehabilitate your injury. They may resolve your symptoms with or without further involvement from your physician, physical therapist, or athletic trainer. While completing these exercises, remember:   Muscles can gain both the endurance and the strength needed for everyday activities through controlled exercises.  Complete these exercises as instructed by your physician, physical therapist, or athletic trainer. Progress the resistance and repetitions only as guided.  You may  experience muscle soreness or fatigue, but the pain or discomfort you are trying to eliminate should never worsen during these exercises. If this pain does worsen, stop and make certain you are following the directions exactly. If the pain is still present after adjustments, discontinue the exercise until you can discuss the trouble with your clinician. STRENGTH - Cervical Flexors, Isometric  Face a wall, standing about 6 inches away. Place a small pillow, a ball about 6-8 inches in diameter, or a folded towel between your forehead and the wall.  Slightly tuck your chin and gently push your forehead into the soft object. Push only with mild to moderate intensity, building up tension gradually. Keep your jaw and forehead relaxed.  Hold 10 to 20 seconds. Keep your breathing relaxed.  Release the tension slowly. Relax your neck muscles completely before you start the next repetition. Repeat __________ times. Complete this exercise __________ times per day. STRENGTH- Cervical Lateral Flexors, Isometric   Stand about 6 inches away from a wall. Place a small pillow, a ball about 6-8 inches in diameter, or a folded towel between the side of your head and the wall.  Slightly tuck your chin and gently tilt your head into the soft object. Push only with mild to moderate intensity, building up tension gradually.  Keep your jaw and forehead relaxed.  Hold 10 to 20 seconds. Keep your breathing relaxed.  Release the tension slowly. Relax your neck muscles completely before you start the next repetition. Repeat __________ times. Complete this exercise __________ times per day. STRENGTH - Cervical Extensors, Isometric   Stand about 6 inches away from a wall. Place a small pillow, a ball about 6-8 inches in diameter, or a folded towel between the back of your head and the wall.  Slightly tuck your chin and gently tilt your head back into the soft object. Push only with mild to moderate intensity, building up  tension gradually. Keep your jaw and forehead relaxed.  Hold 10 to 20 seconds. Keep your breathing relaxed.  Release the tension slowly. Relax your neck muscles completely before you start the next repetition. Repeat __________ times. Complete this exercise __________ times per day. POSTURE AND BODY MECHANICS CONSIDERATIONS - Cervical Strain and Sprain Keeping correct posture when sitting, standing or completing your activities will reduce the stress put on different body tissues, allowing injured tissues a chance to heal and limiting painful experiences. The following are general guidelines for improved posture. Your physician or physical therapist will provide you with any instructions specific to your needs. While reading these guidelines, remember:  The exercises prescribed by your provider will help you have the flexibility and strength to maintain correct postures.  The correct posture provides the optimal environment for your joints to work. All of your joints have less wear and tear when properly supported by a spine with good posture. This means you will experience a healthier, less painful body.  Correct posture must be practiced with all of your activities, especially prolonged sitting and standing. Correct posture is as important when doing repetitive low-stress activities (typing) as it is when doing a single heavy-load activity (lifting). PROLONGED STANDING WHILE SLIGHTLY LEANING FORWARD When completing a task that requires you to lean forward while standing in one place for a long time, place either foot up on a stationary 2- to 4-inch high object to help maintain the best posture. When both feet are on the ground, the low back tends to lose its slight inward curve. If this curve flattens (or becomes too large), then the back and your other joints will experience too much stress, fatigue more quickly, and can cause pain.  RESTING POSITIONS Consider which positions are most painful for  you when choosing a resting position. If you have pain with flexion-based activities (sitting, bending, stooping, squatting), choose a position that allows you to rest in a less flexed posture. You would want to avoid curling into a fetal position on your side. If your pain worsens with extension-based activities (prolonged standing, working overhead), avoid resting in an extended position such as sleeping on your stomach. Most people will find more comfort when they rest with their spine in a more neutral position, neither too rounded nor too arched. Lying on a non-sagging bed on your side with a pillow between your knees, or on your back with a pillow under your knees will often provide some relief. Keep in mind, being in any one position for a prolonged period of time, no matter how correct your posture, can still lead to stiffness. WALKING Walk with an upright posture. Your ears, shoulders, and hips should all line up. OFFICE WORK When working at a desk, create an environment that supports good, upright posture. Without extra support, muscles fatigue and lead to excessive strain on joints and other  tissues. CHAIR:  A chair should be able to slide under your desk when your back makes contact with the back of the chair. This allows you to work closely.  The chair's height should allow your eyes to be level with the upper part of your monitor and your hands to be slightly lower than your elbows.  Body position:  Your feet should make contact with the floor. If this is not possible, use a foot rest.  Keep your ears over your shoulders. This will reduce stress on your neck and low back.   This information is not intended to replace advice given to you by your health care provider. Make sure you discuss any questions you have with your health care provider.   Document Released: 11/25/2005 Document Revised: 12/16/2014 Document Reviewed: 03/09/2009 [employer] Interactive Patient Education [employer]

## 2016-04-24 LAB — COMPREHENSIVE METABOLIC PANEL
A/G RATIO: 1.9 (ref 1.2–2.2)
ALT: 22 IU/L (ref 0–32)
AST: 18 IU/L (ref 0–40)
Albumin: 4.7 g/dL (ref 3.5–5.5)
Alkaline Phosphatase: 60 IU/L (ref 39–117)
BILIRUBIN TOTAL: 0.2 mg/dL (ref 0.0–1.2)
BUN / CREAT RATIO: 16 (ref 9–23)
BUN: 14 mg/dL (ref 6–24)
CHLORIDE: 101 mmol/L (ref 96–106)
CO2: 23 mmol/L (ref 18–29)
Calcium: 9.5 mg/dL (ref 8.7–10.2)
Creatinine, Ser: 0.9 mg/dL (ref 0.57–1.00)
GFR calc Af Amer: 83 mL/min/{1.73_m2} (ref >59)
GFR calc non Af Amer: 72 mL/min/{1.73_m2} (ref >59)
GLOBULIN, TOTAL: 2.5 g/dL (ref 1.5–4.5)
GLUCOSE: 97 mg/dL (ref 65–99)
POTASSIUM: 4.4 mmol/L (ref 3.5–5.2)
SODIUM: 141 mmol/L (ref 134–144)
TOTAL PROTEIN: 7.2 g/dL (ref 6.0–8.5)

## 2016-04-24 LAB — CBC WITH DIFFERENTIAL/PLATELET
BASOS ABS: 0 10*3/uL (ref 0.0–0.2)
Basos: 0 %
EOS (ABSOLUTE): 0.1 10*3/uL (ref 0.0–0.4)
Eos: 1 %
Hematocrit: 46.8 % — ABNORMAL HIGH (ref 34.0–46.6)
Hemoglobin: 15.6 g/dL (ref 11.1–15.9)
Immature Grans (Abs): 0 10*3/uL (ref 0.0–0.1)
Immature Granulocytes: 0 %
LYMPHS ABS: 1.8 10*3/uL (ref 0.7–3.1)
Lymphs: 38 %
MCH: 30.6 pg (ref 26.6–33.0)
MCHC: 33.3 g/dL (ref 31.5–35.7)
MCV: 92 fL (ref 79–97)
MONOS ABS: 0.3 10*3/uL (ref 0.1–0.9)
Monocytes: 7 %
NEUTROS ABS: 2.5 10*3/uL (ref 1.4–7.0)
Neutrophils: 54 %
Platelets: 259 10*3/uL (ref 150–379)
RBC: 5.1 x10E6/uL (ref 3.77–5.28)
RDW: 14.2 % (ref 12.3–15.4)
WBC: 4.8 10*3/uL (ref 3.4–10.8)

## 2016-04-24 LAB — LIPID PANEL
CHOL/HDL RATIO: 3.2 ratio (ref 0.0–4.4)
CHOLESTEROL TOTAL: 160 mg/dL (ref 100–199)
HDL: 50 mg/dL (ref >39)
LDL CALC: 89 mg/dL (ref 0–99)
Triglycerides: 103 mg/dL (ref 0–149)
VLDL CHOLESTEROL CAL: 21 mg/dL (ref 5–40)

## 2016-04-24 LAB — HEMOGLOBIN A1C
Est. average glucose Bld gHb Est-mCnc: 117 mg/dL
Hgb A1c MFr Bld: 5.7 % — ABNORMAL HIGH (ref 4.8–5.6)

## 2016-04-24 LAB — TSH: TSH: 0.518 u[IU]/mL (ref 0.450–4.500)

## 2016-04-24 LAB — T4, FREE: FREE T4: 1.65 ng/dL (ref 0.82–1.77)

## 2016-05-04 ENCOUNTER — Other Ambulatory Visit: Payer: Self-pay | Admitting: Family Medicine

## 2016-05-07 NOTE — Telephone Encounter (Signed)
Please fax or call into pharmacy.

## 2016-05-08 NOTE — Telephone Encounter (Signed)
Faxed

## 2016-05-09 ENCOUNTER — Ambulatory Visit
Admission: RE | Admit: 2016-05-09 | Discharge: 2016-05-09 | Disposition: A | Discharge status: Home / Self Care | Payer: 59 | Source: Ambulatory Visit | Attending: Family Medicine | Admitting: Family Medicine

## 2016-05-09 DIAGNOSIS — Z1231 Encounter for screening mammogram for malignant neoplasm of breast: Secondary | ICD-10-CM | POA: Insufficient documentation

## 2016-05-22 ENCOUNTER — Encounter: Payer: Self-pay | Admitting: Family Medicine

## 2016-07-05 ENCOUNTER — Other Ambulatory Visit: Payer: Self-pay | Admitting: Family Medicine

## 2016-10-17 ENCOUNTER — Other Ambulatory Visit: Payer: Self-pay | Admitting: Family Medicine

## 2016-11-05 ENCOUNTER — Encounter: Payer: Self-pay | Admitting: Family Medicine

## 2016-11-05 ENCOUNTER — Ambulatory Visit (INDEPENDENT_AMBULATORY_CARE_PROVIDER_SITE_OTHER): Payer: 59 | Admitting: Family Medicine

## 2016-11-05 VITALS — BP 130/82 | HR 105 | Temp 98.4°F | Resp 16 | Ht 65.0 in | Wt 230.0 lb

## 2016-11-05 DIAGNOSIS — G4730 Sleep apnea, unspecified: Secondary | ICD-10-CM

## 2016-11-05 DIAGNOSIS — R7302 Impaired glucose tolerance (oral): Secondary | ICD-10-CM | POA: Diagnosis not present

## 2016-11-05 DIAGNOSIS — M5030 Other cervical disc degeneration, unspecified cervical region: Secondary | ICD-10-CM

## 2016-11-05 DIAGNOSIS — M5136 Other intervertebral disc degeneration, lumbar region: Secondary | ICD-10-CM | POA: Diagnosis not present

## 2016-11-05 DIAGNOSIS — Z0000 Encounter for general adult medical examination without abnormal findings: Secondary | ICD-10-CM

## 2016-11-05 DIAGNOSIS — E890 Postprocedural hypothyroidism: Secondary | ICD-10-CM | POA: Diagnosis not present

## 2016-11-05 DIAGNOSIS — I10 Essential (primary) hypertension: Secondary | ICD-10-CM | POA: Diagnosis not present

## 2016-11-05 DIAGNOSIS — F411 Generalized anxiety disorder: Secondary | ICD-10-CM

## 2016-11-05 DIAGNOSIS — Z803 Family history of malignant neoplasm of breast: Secondary | ICD-10-CM | POA: Diagnosis not present

## 2016-11-05 DIAGNOSIS — Z23 Encounter for immunization: Secondary | ICD-10-CM

## 2016-11-05 DIAGNOSIS — G4700 Insomnia, unspecified: Secondary | ICD-10-CM

## 2016-11-05 DIAGNOSIS — E7800 Pure hypercholesterolemia, unspecified: Secondary | ICD-10-CM

## 2016-11-05 DIAGNOSIS — Z720 Tobacco use: Secondary | ICD-10-CM

## 2016-11-05 LAB — POCT URINALYSIS DIP (MANUAL ENTRY)
Bilirubin, UA: NEGATIVE
Glucose, UA: NEGATIVE
Ketones, POC UA: NEGATIVE
LEUKOCYTES UA: NEGATIVE
Nitrite, UA: NEGATIVE
PROTEIN UA: NEGATIVE
Spec Grav, UA: 1.01
UROBILINOGEN UA: 0.2
pH, UA: 5.5

## 2016-11-05 MED ORDER — LISINOPRIL-HYDROCHLOROTHIAZIDE 10-12.5 MG PO TABS: 1.0000 | ORAL_TABLET | Freq: Every day | ORAL | 3 refills | Status: DC

## 2016-11-05 MED ORDER — LEVOTHYROXINE SODIUM 100 MCG PO TABS: ORAL_TABLET | ORAL | 3 refills | Status: DC

## 2016-11-05 MED ORDER — CYCLOBENZAPRINE HCL 10 MG PO TABS: 10.0000 mg | ORAL_TABLET | Freq: Three times a day (TID) | ORAL | 1 refills | Status: DC | PRN

## 2016-11-05 MED ORDER — ALPRAZOLAM 0.5 MG PO TABS: ORAL_TABLET | ORAL | 5 refills | Status: DC

## 2016-11-05 MED ORDER — ZOLPIDEM TARTRATE 10 MG PO TABS: 10.0000 mg | ORAL_TABLET | Freq: Every evening | ORAL | 0 refills | Status: DC | PRN

## 2016-11-05 MED ORDER — LOVASTATIN 40 MG PO TABS: 40.0000 mg | ORAL_TABLET | Freq: Every day | ORAL | 3 refills | Status: DC

## 2016-11-05 NOTE — Progress Notes (Signed)
Subjective:    Patient ID: Beth Hughes, female    DOB: [birth year], 55 y.o.   MRN: [id number]  11/05/2016  Annual Exam (CPE) and Immunizations (FLU)   HPI This 55 y.o. female presents for Complete Physical Examination.  Last physical:  10-20-2015 Pap smear:  08-16-2013; hysterectomy for ovarian cyst. Ovaries removed.  Mammogram:  05-2016 Colonoscopy:  2011; went for repeat but did not repeat; repeat in 2021; completed stool kit in 2016. Eye exam:  2017; +glasses; no cataracts; no glaucoma or macular degeneration. Dental exam: dentures upper and lower.   Immunization History  Administered Date(s) Administered  . Influenza Split 10/22/2013, 10/18/2015  . Influenza,inj,Quad PF,36+ Mos 10/19/2014, 11/05/2016  . Pneumococcal Polysaccharide-23 04/23/2011  . Tdap 04/23/2011   BP Readings from Last 3 Encounters:  11/05/16 130/82  04/23/16 106/82  10/20/15 123/83   Wt Readings from Last 3 Encounters:  11/05/16 230 lb (104.3 kg)  04/23/16 213 lb (96.6 kg)  10/20/15 208 lb 3.2 oz (94.4 kg)     Review of Systems  Constitutional: Negative for activity change, appetite change, chills, diaphoresis, fatigue, fever and unexpected weight change.  HENT: Negative for congestion, dental problem, drooling, ear discharge, ear pain, facial swelling, hearing loss, mouth sores, nosebleeds, postnasal drip, rhinorrhea, sinus pressure, sneezing, sore throat, tinnitus, trouble swallowing and voice change.   Eyes: Negative for photophobia, pain, discharge, redness, itching and visual disturbance.  Respiratory: Negative for apnea, cough, choking, chest tightness, shortness of breath, wheezing and stridor.   Cardiovascular: Negative for chest pain, palpitations and leg swelling.  Gastrointestinal: Negative for abdominal distention, abdominal pain, anal bleeding, blood in stool, constipation, diarrhea, nausea, rectal pain and vomiting.  Endocrine: Negative for cold intolerance, heat intolerance,  polydipsia, polyphagia and polyuria.  Genitourinary: Negative for decreased urine volume, difficulty urinating, dyspareunia, dysuria, enuresis, flank pain, frequency, genital sores, hematuria, menstrual problem, pelvic pain, urgency, vaginal bleeding, vaginal discharge and vaginal pain.  Musculoskeletal: Negative for arthralgias, back pain, gait problem, joint swelling, myalgias, neck pain and neck stiffness.  Skin: Negative for color change, pallor, rash and wound.  Allergic/Immunologic: Negative for environmental allergies, food allergies and immunocompromised state.  Neurological: Negative for dizziness, tremors, seizures, syncope, facial asymmetry, speech difficulty, weakness, light-headedness, numbness and headaches.  Hematological: Negative for adenopathy. Does not bruise/bleed easily.  Psychiatric/Behavioral: Negative for agitation, behavioral problems, confusion, decreased concentration, dysphoric mood, hallucinations, self-injury, sleep disturbance and suicidal ideas. The patient is not nervous/anxious and is not hyperactive.     Past Medical History:  Diagnosis Date  . Abnormal LFTs (liver function tests)   . Anxiety   . Hypertension   . Insomnia, unspecified   . Other and unspecified hyperlipidemia   . Snoring 10/21/2014  . Tobacco use disorder   . Unspecified hypothyroidism    Past Surgical History:  Procedure Laterality Date  . ABDOMINAL HYSTERECTOMY  12/09/1998   ovarian cysts; B ooophorectomy.  No  cervical dysplasia.  [place].  . COLONOSCOPY W/ POLYPECTOMY  12/09/2008   colon polyps. [place].  Repeat in 5 years.   Allergies  Allergen Reactions  . Penicillins     Blacked out  . Pseudoephedrine     Heart races, seizures   Current Outpatient Prescriptions  Medication Sig Dispense Refill  . ALPRAZolam (XANAX) 0.5 MG tablet TAKE ONE TABLET BY MOUTH AT BEDTIME AS NEEDED FOR ANXIETY 30 tablet 5  . aspirin EC 81 MG tablet Take 1 tablet (81 mg total) by mouth daily. 1 tablet  0  . cyclobenzaprine (  FLEXERIL) 10 MG tablet Take 1 tablet (10 mg total) by mouth 3 (three) times daily as needed. 180 tablet 1  . levothyroxine (SYNTHROID, LEVOTHROID) 100 MCG tablet Take one tablet by mouth daily on every day, except on Tuesdays and Thursdays take 1 1/2 tablets. 105 tablet 3  . lisinopril-hydrochlorothiazide (PRINZIDE,ZESTORETIC) 10-12.5 MG tablet Take 1 tablet by mouth daily. 90 tablet 3  . lovastatin (MEVACOR) 40 MG tablet Take 1 tablet (40 mg total) by mouth at bedtime. 90 tablet 3  . nystatin cream (MYCOSTATIN) Apply 1 application topically 2 (two) times daily. 30 g 5  . zolpidem (AMBIEN) 10 MG tablet Take 1 tablet (10 mg total) by mouth at bedtime as needed for sleep. 30 tablet 0   No current facility-administered medications for this visit.    Social History   Social History  . Marital status: Single    Spouse name: N/A  . Number of children: 1  . Years of education: N/A   Occupational History  . [employer] Freight forwarder    Social History Main Topics  . Smoking status: Current Every Day Smoker    Packs/day: 1.00    Years: 40.00  . Smokeless tobacco: Never Used  . Alcohol use 0.0 oz/week     Comment: occasionally mixed drink  . Drug use: No  . Sexual activity: Not Currently     Comment: Last sexual activity 2004.   Other Topics Concern  . Not on file   Social History Narrative   Marital status: single; not dating.  Not interested.      Children; one son (61); no grandchildren      Lives: with son.        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      Tobacco: 1 ppd x 40 years.   Quit for one month.      Alcohol: none; rarely; once per month.      Drugs: none      Exercise: none; walking 6500 steps per day in 2017.      Seatbelt:  100%      Guns: none      Sexual activity:  Not sexual activity:  Last sexual activity over ten years ago; no STDs.  Last STD testing five years ago.   Family History  Problem Relation Age of Onset  .  Congestive Heart Failure Mother   . Diabetes Mother   . COPD Mother   . Atrial fibrillation Mother   . Macular degeneration Mother   . Heart disease Mother 52    CABG  . Stroke Mother 83    brain aneurysm in 41  . Aneurysm Mother   . Breast cancer Sister 92    genetic testing negative  . Heart disease Sister     atrial fibrillation  . Cancer Sister 91    breast cancer  . Alcohol abuse Brother   . COPD Brother   . Congestive Heart Failure Brother   . Diabetes Brother   . Heart disease Brother     atrial fibrllation  . Hepatitis C Brother   . COPD Brother   . Atrial fibrillation Brother        Objective:    BP 130/82 (BP Location: Left Arm, Patient Position: Sitting, Cuff Size: Large)   Pulse (!) 105   Temp 98.4 F (36.9 C) (Oral)   Resp 16   Ht '5\' 5"'  (1.651 m)   Wt 230 lb (104.3 kg)  SpO2 94%   BMI 38.27 kg/m  Physical Exam  Constitutional: She is oriented to person, place, and time. She appears well-developed and well-nourished. No distress.  HENT:  Head: Normocephalic and atraumatic.  Right Ear: External ear normal.  Left Ear: External ear normal.  Nose: Nose normal.  Mouth/Throat: Oropharynx is clear and moist.  Eyes: Conjunctivae and EOM are normal. Pupils are equal, round, and reactive to light.  Neck: Normal range of motion and full passive range of motion without pain. Neck supple. No JVD present. Carotid bruit is not present. No thyromegaly present.  Cardiovascular: Normal rate, regular rhythm and normal heart sounds.  Exam reveals no gallop and no friction rub.   No murmur heard. Pulmonary/Chest: Effort normal and breath sounds normal. She has no wheezes. She has no rales. Right breast exhibits no inverted nipple, no mass, no nipple discharge, no skin change and no tenderness. Left breast exhibits no inverted nipple, no mass, no nipple discharge, no skin change and no tenderness. Breasts are symmetrical.  Abdominal: Soft. Bowel sounds are normal. She  exhibits no distension and no mass. There is no tenderness. There is no rebound and no guarding.  Genitourinary: Vagina normal.  Musculoskeletal:       Right shoulder: Normal.       Left shoulder: Normal.       Cervical back: Normal.  Lymphadenopathy:    She has no cervical adenopathy.  Neurological: She is alert and oriented to person, place, and time. She has normal reflexes. No cranial nerve deficit. She exhibits normal muscle tone. Coordination normal.  Skin: Skin is warm and dry. No rash noted. She is not diaphoretic. No erythema. No pallor.  Psychiatric: She has a normal mood and affect. Her behavior is normal. Judgment and thought content normal.  Nursing note and vitals reviewed.   Depression screen [facility] 2/9 11/05/2016 04/23/2016 10/20/2015 04/19/2015 10/19/2014  Decreased Interest 0 0 0 0 0  Down, Depressed, Hopeless 0 0 0 0 0  PHQ - 2 Score 0 0 0 0 0   Fall Risk  11/05/2016 04/23/2016 10/20/2015 10/19/2014  Falls in the past year? No No No No       Assessment & Plan:   1. Routine physical examination   2. Needs flu shot   3. Essential hypertension, benign   4. Hypothyroidism, postradioiodine therapy   5. Glucose intolerance (impaired glucose tolerance)   6. Degenerative disc disease, cervical   7. Degenerative disc disease, lumbar   8. Tobacco abuse   9. Pure hypercholesterolemia   10. Anxiety state   11. Family history of breast cancer in sister   6. Severe obesity (BMI >= 40) (HCC)   13. Insomnia with sleep apnea    -anticipatory guidance -exercise, weight loss, 3 servings of dairy daily, tobacco cessation. -s/p flu vaccine. -obtain labs. -refills provided.   Orders Placed This Encounter  Procedures  . Flu Vaccine QUAD 36+ mos PF IM (Fluarix & Fluzone Quad PF)  . CBC with Differential/Platelet    Standing Status:   Future    Standing Expiration Date:   11/05/2017  . Comprehensive metabolic panel    Standing Status:   Future    Standing Expiration Date:    11/05/2017    Order Specific Question:   Has the patient fasted?    Answer:   Yes  . Lipid panel    Standing Status:   Future    Standing Expiration Date:   11/05/2017    Order Specific  Question:   Has the patient fasted?    Answer:   Yes  . TSH    Standing Status:   Future    Standing Expiration Date:   11/05/2017  . Hemoglobin A1c    Standing Status:   Future    Standing Expiration Date:   11/05/2017  . T4, free    Standing Status:   Future    Standing Expiration Date:   11/05/2017  . POCT urinalysis dipstick  . EKG 12-Lead   Meds ordered this encounter  Medications  . levothyroxine (SYNTHROID, LEVOTHROID) 100 MCG tablet    Sig: Take one tablet by mouth daily on every day, except on Tuesdays and Thursdays take 1 1/2 tablets.    Dispense:  105 tablet    Refill:  3  . cyclobenzaprine (FLEXERIL) 10 MG tablet    Sig: Take 1 tablet (10 mg total) by mouth 3 (three) times daily as needed.    Dispense:  180 tablet    Refill:  1  . ALPRAZolam (XANAX) 0.5 MG tablet    Sig: TAKE ONE TABLET BY MOUTH AT BEDTIME AS NEEDED FOR ANXIETY    Dispense:  30 tablet    Refill:  5  . lisinopril-hydrochlorothiazide (PRINZIDE,ZESTORETIC) 10-12.5 MG tablet    Sig: Take 1 tablet by mouth daily.    Dispense:  90 tablet    Refill:  3  . lovastatin (MEVACOR) 40 MG tablet    Sig: Take 1 tablet (40 mg total) by mouth at bedtime.    Dispense:  90 tablet    Refill:  3  . zolpidem (AMBIEN) 10 MG tablet    Sig: Take 1 tablet (10 mg total) by mouth at bedtime as needed for sleep.    Dispense:  30 tablet    Refill:  0    Return in about 6 months (around 05/05/2017) for recheck hypertension, high cholesterol.   Natash Berman Elayne Guerin, M.D. [facility] [address] [place], [place]  [address] [phone] phone [phone] fax

## 2016-11-05 NOTE — Patient Instructions (Addendum)
   IF you received an x-ray today, you will receive an invoice from [facility]. Please contact [facility] at [phone] with questions or concerns regarding your invoice.   IF you received labwork today, you will receive an invoice from [facility]. Please contact [facility] at [phone] with questions or concerns regarding your invoice.   Our billing staff will not be able to assist you with questions regarding bills from these companies.  You will be contacted with the lab results as soon as they are available. The fastest way to get your results is to activate your My Chart account. Instructions are located on the last page of this paperwork. If you have not heard from us regarding the results in 2 weeks, please contact this office.    Keeping You Healthy  Get These Tests  Blood Pressure- Have your blood pressure checked by your healthcare provider at least once a year.  Normal blood pressure is 120/80.  Weight- Have your body mass index (BMI) calculated to screen for obesity.  BMI is a measure of body fat based on height and weight.  You can calculate your own BMI at www.nhlbisupport.com/bmi/  Cholesterol- Have your cholesterol checked every year.  Diabetes- Have your blood sugar checked every year if you have high blood pressure, high cholesterol, a family history of diabetes or if you are overweight.  Pap Test - Have a pap test every 1 to 5 years if you have been sexually active.  If you are older than 65 and recent pap tests have been normal you may not need additional pap tests.  In addition, if you have had a hysterectomy  for benign disease additional pap tests are not necessary.  Mammogram-Yearly mammograms are essential for early detection of breast cancer  Screening for Colon Cancer- Colonoscopy starting at age 50. Screening may begin sooner depending on your family history and other health conditions.  Follow up colonoscopy  as directed by your Gastroenterologist.  Screening for Osteoporosis- Screening begins at age 65 with bone density scanning, sooner if you are at higher risk for developing Osteoporosis.  Get these medicines  Calcium with Vitamin D- Your body requires 1200-1500 mg of Calcium a day and 800-1000 IU of Vitamin D a day.  You can only absorb 500 mg of Calcium at a time therefore Calcium must be taken in 2 or 3 separate doses throughout the day.  Hormones- Hormone therapy has been associated with increased risk for certain cancers and heart disease.  Talk to your healthcare provider about if you need relief from menopausal symptoms.  Aspirin- Ask your healthcare provider about taking Aspirin to prevent Heart Disease and Stroke.  Get these Immuniztions  Flu shot- Every fall  Pneumonia shot- Once after the age of 65; if you are younger ask your healthcare provider if you need a pneumonia shot.  Tetanus- Every ten years.  Zostavax- Once after the age of 60 to prevent shingles.  Take these steps  Don't smoke- Your healthcare provider can help you quit. For tips on how to quit, ask your healthcare provider or go to www.smokefree.gov or call 1-800 QUIT-NOW.  Be physically active- Exercise 5 days a week for a minimum of 30 minutes.  If you are not already physically active, start slow and gradually work up to 30 minutes of moderate physical activity.  Try walking, dancing, bike riding, swimming, etc.  Eat a healthy diet- Eat a variety of healthy foods such as fruits, vegetables, whole   grains, low fat milk, low fat cheeses, yogurt, lean meats, chicken, fish, eggs, dried beans, tofu, etc.  For more information go to www.thenutritionsource.org  Dental visit- Brush and floss teeth twice daily; visit your dentist twice a year.  Eye exam- Visit your Optometrist or Ophthalmologist yearly.  Drink alcohol in moderation- Limit alcohol intake to one drink or less a day.  Never drink and  drive.  Depression- Your emotional health is as important as your physical health.  If you're feeling down or losing interest in things you normally enjoy, please talk to your healthcare provider.  Seat Belts- can save your life; always wear one  Smoke/Carbon Monoxide detectors- These detectors need to be installed on the appropriate level of your home.  Replace batteries at least once a year.  Violence- If anyone is threatening or hurting you, please tell your healthcare provider.  Living Will/ Health care power of attorney- Discuss with your healthcare provider and family. 

## 2017-05-06 ENCOUNTER — Ambulatory Visit (INDEPENDENT_AMBULATORY_CARE_PROVIDER_SITE_OTHER): Payer: 59

## 2017-05-06 ENCOUNTER — Encounter: Payer: Self-pay | Admitting: Family Medicine

## 2017-05-06 ENCOUNTER — Ambulatory Visit (INDEPENDENT_AMBULATORY_CARE_PROVIDER_SITE_OTHER): Payer: 59 | Admitting: Family Medicine

## 2017-05-06 VITALS — BP 133/84 | HR 98 | Temp 98.1°F | Resp 16 | Ht 65.0 in | Wt 234.8 lb

## 2017-05-06 DIAGNOSIS — F411 Generalized anxiety disorder: Secondary | ICD-10-CM

## 2017-05-06 DIAGNOSIS — M545 Low back pain, unspecified: Secondary | ICD-10-CM

## 2017-05-06 DIAGNOSIS — I10 Essential (primary) hypertension: Secondary | ICD-10-CM | POA: Diagnosis not present

## 2017-05-06 DIAGNOSIS — E7800 Pure hypercholesterolemia, unspecified: Secondary | ICD-10-CM

## 2017-05-06 DIAGNOSIS — R7302 Impaired glucose tolerance (oral): Secondary | ICD-10-CM

## 2017-05-06 DIAGNOSIS — M5030 Other cervical disc degeneration, unspecified cervical region: Secondary | ICD-10-CM

## 2017-05-06 DIAGNOSIS — E890 Postprocedural hypothyroidism: Secondary | ICD-10-CM

## 2017-05-06 DIAGNOSIS — M5136 Other intervertebral disc degeneration, lumbar region: Secondary | ICD-10-CM

## 2017-05-06 DIAGNOSIS — Z720 Tobacco use: Secondary | ICD-10-CM

## 2017-05-06 MED ORDER — ALPRAZOLAM 0.5 MG PO TABS: ORAL_TABLET | ORAL | 5 refills | Status: DC

## 2017-05-06 MED ORDER — MELOXICAM 7.5 MG PO TABS: 7.5000 mg | ORAL_TABLET | Freq: Every day | ORAL | 0 refills | Status: DC

## 2017-05-06 MED ORDER — CYCLOBENZAPRINE HCL 10 MG PO TABS
10.0000 mg | ORAL_TABLET | Freq: Three times a day (TID) | ORAL | 1 refills | Status: DC | PRN
Start: 2017-05-06 — End: 2017-10-02

## 2017-05-06 NOTE — Patient Instructions (Addendum)
IF you received an x-ray today, you will receive an invoice from [facility]. Please contact [facility] at [phone] with questions or concerns regarding your invoice.   IF you received labwork today, you will receive an invoice from [facility]. Please contact [facility] at [phone] with questions or concerns regarding your invoice.   Our billing staff will not be able to assist you with questions regarding bills from these companies.  You will be contacted with the lab results as soon as they are available. The fastest way to get your results is to activate your My Chart account. Instructions are located on the last page of this paperwork. If you have not heard from [place] regarding the results in 2 weeks, please contact this office.     l Low Back Sprain Rehab Ask your health care provider which exercises are safe for you. Do exercises exactly as told by your health care provider and adjust them as directed. It is normal to feel mild stretching, pulling, tightness, or discomfort as you do these exercises, but you should stop right away if you feel sudden pain or your pain gets worse. Do not begin these exercises until told by your health care provider. Stretching and range of motion exercises These exercises warm up your muscles and joints and improve the movement and flexibility of your back. These exercises also help to relieve pain, numbness, and tingling. Exercise A: Lumbar rotation   1. Lie on your back on a firm surface and bend your knees. 2. Straighten your arms out to your sides so each arm forms an "L" shape with a side of your body (a 90 degree angle). 3. Slowly move both of your knees to one side of your body until you feel a stretch in your lower back. Try not to let your shoulders move off of the floor. 4. Hold for __________ seconds. 5. Tense your abdominal muscles and slowly move your knees back to the starting position. 6. Repeat this exercise on the  other side of your body. Repeat __________ times. Complete this exercise __________ times a day. Exercise B: Prone extension on elbows   1. Lie on your abdomen on a firm surface. 2. Prop yourself up on your elbows. 3. Use your arms to help lift your chest up until you feel a gentle stretch in your abdomen and your lower back.  This will place some of your body weight on your elbows. If this is uncomfortable, try stacking pillows under your chest.  Your hips should stay down, against the surface that you are lying on. Keep your hip and back muscles relaxed. 4. Hold for __________ seconds. 5. Slowly relax your upper body and return to the starting position. Repeat __________ times. Complete this exercise __________ times a day. Strengthening exercises These exercises build strength and endurance in your back. Endurance is the ability to use your muscles for a long time, even after they get tired. Exercise C: Pelvic tilt  1. Lie on your back on a firm surface. Bend your knees and keep your feet flat. 2. Tense your abdominal muscles. Tip your pelvis up toward the ceiling and flatten your lower back into the floor.  To help with this exercise, you may place a small towel under your lower back and try to push your back into the towel. 3. Hold for __________ seconds. 4. Let your muscles relax completely before you repeat this exercise. Repeat __________ times. Complete this exercise __________ times a day.  Exercise D: Alternating arm and leg raises   1. Get on your hands and knees on a firm surface. If you are on a hard floor, you may want to use padding to cushion your knees, such as an exercise mat. 2. Line up your arms and legs. Your hands should be below your shoulders, and your knees should be below your hips. 3. Lift your left leg behind you. At the same time, raise your right arm and straighten it in front of you.  Do not lift your leg higher than your hip.  Do not lift your arm  higher than your shoulder.  Keep your abdominal and back muscles tight.  Keep your hips facing the ground.  Do not arch your back.  Keep your balance carefully, and do not hold your breath. 4. Hold for __________ seconds. 5. Slowly return to the starting position and repeat with your right leg and your left arm. Repeat __________ times. Complete this exercise __________ times a day. Exercise E: Abdominal set with straight leg raise   1. Lie on your back on a firm surface. 2. Bend one of your knees and keep your other leg straight. 3. Tense your abdominal muscles and lift your straight leg up, 4-6 inches (10-15 cm) off the ground. 4. Keep your abdominal muscles tight and hold for __________ seconds.  Do not hold your breath.  Do not arch your back. Keep it flat against the ground. 5. Keep your abdominal muscles tense as you slowly lower your leg back to the starting position. 6. Repeat with your other leg. Repeat __________ times. Complete this exercise __________ times a day. Posture and body mechanics   Body mechanics refers to the movements and positions of your body while you do your daily activities. Posture is part of body mechanics. Good posture and healthy body mechanics can help to relieve stress in your body's tissues and joints. Good posture means that your spine is in its natural S-curve position (your spine is neutral), your shoulders are pulled back slightly, and your head is not tipped forward. The following are general guidelines for applying improved posture and body mechanics to your everyday activities. Standing    When standing, keep your spine neutral and your feet about hip-width apart. Keep a slight bend in your knees. Your ears, shoulders, and hips should line up.  When you do a task in which you stand in one place for a long time, place one foot up on a stable object that is 2-4 inches (5-10 cm) high, such as a footstool. This helps keep your spine  neutral. Sitting    When sitting, keep your spine neutral and keep your feet flat on the floor. Use a footrest, if necessary, and keep your thighs parallel to the floor. Avoid rounding your shoulders, and avoid tilting your head forward.  When working at a desk or a computer, keep your desk at a height where your hands are slightly lower than your elbows. Slide your chair under your desk so you are close enough to maintain good posture.  When working at a computer, place your monitor at a height where you are looking straight ahead and you do not have to tilt your head forward or downward to look at the screen. Resting    When lying down and resting, avoid positions that are most painful for you.  If you have pain with activities such as sitting, bending, stooping, or squatting (flexion-based activities), lie in a position in  which your body does not bend very much. For example, avoid curling up on your side with your arms and knees near your chest (fetal position).  If you have pain with activities such as standing for a long time or reaching with your arms (extension-based activities), lie with your spine in a neutral position and bend your knees slightly. Try the following positions:  Lying on your side with a pillow between your knees.  Lying on your back with a pillow under your knees. Lifting    When lifting objects, keep your feet at least shoulder-width apart and tighten your abdominal muscles.  Bend your knees and hips and keep your spine neutral. It is important to lift using the strength of your legs, not your back. Do not lock your knees straight out.  Always ask for help to lift heavy or awkward objects. This information is not intended to replace advice given to you by your health care provider. Make sure you discuss any questions you have with your health care provider. Document Released: 11/25/2005 Document Revised: 08/01/2016 Document Reviewed: 09/06/2015 Elsevier  Interactive Patient Education  2017 [employer].

## 2017-05-06 NOTE — Progress Notes (Signed)
Subjective:    Patient ID: Beth Hughes, female    DOB: Jul 20, 1961, 56 y.o.   MRN: [id number]  05/06/2017  Follow-up (hypertension, high cholesterol)   HPI This 56 y.o. female presents for six month follow-up of hypertension, hypercholesterolemia, glucose intolerance, hypothyroidism.  Patient reports good compliance with medication, good tolerance to medication, and good symptom control.  Not exercising; continues to smoke.  Not checking BP at home.  Lower back pain and R hip pain: no injury.  No radiation into leg; just hip.  Pain with all positions.  Taking nothing other Flexeril.  No opiates.   No saddle paresthesias.  No b/b dysfunction.  Onset one month ago.    Immunization History  Administered Date(s) Administered  . Influenza Split 10/22/2013, 10/18/2015  . Influenza,inj,Quad PF,36+ Mos 10/19/2014, 11/05/2016  . Pneumococcal Polysaccharide-23 04/23/2011  . Tdap 04/23/2011   BP Readings from Last 3 Encounters:  05/06/17 133/84  11/05/16 130/82  04/23/16 106/82   Wt Readings from Last 3 Encounters:  05/06/17 234 lb 12.8 oz (106.5 kg)  11/05/16 230 lb (104.3 kg)  04/23/16 213 lb (96.6 kg)    Review of Systems  Constitutional: Negative for chills, diaphoresis, fatigue and fever.  Eyes: Negative for visual disturbance.  Respiratory: Negative for cough and shortness of breath.   Cardiovascular: Negative for chest pain, palpitations and leg swelling.  Gastrointestinal: Negative for abdominal pain, constipation, diarrhea, nausea and vomiting.  Endocrine: Negative for cold intolerance, heat intolerance, polydipsia, polyphagia and polyuria.  Genitourinary: Negative for decreased urine volume and difficulty urinating.  Musculoskeletal: Positive for back pain.  Neurological: Negative for dizziness, tremors, seizures, syncope, facial asymmetry, speech difficulty, weakness, light-headedness, numbness and headaches.  Psychiatric/Behavioral: Positive for sleep disturbance.  Negative for dysphoric mood, self-injury and suicidal ideas. The patient is nervous/anxious.     Past Medical History:  Diagnosis Date  . Abnormal LFTs (liver function tests)   . Anxiety   . Hypertension   . Insomnia, unspecified   . Other and unspecified hyperlipidemia   . Snoring 10/21/2014  . Tobacco use disorder   . Unspecified hypothyroidism    Past Surgical History:  Procedure Laterality Date  . ABDOMINAL HYSTERECTOMY  12/09/1998   ovarian cysts; B ooophorectomy.  No  cervical dysplasia.  Rosenow.  . COLONOSCOPY W/ POLYPECTOMY  12/09/2008   colon polyps. Iftikhar.  Repeat in 5 years.   Allergies  Allergen Reactions  . Penicillins     Blacked out  . Pseudoephedrine     Heart races, seizures    Social History   Social History  . Marital status: Single    Spouse name: N/A  . Number of children: 1  . Years of education: N/A   Occupational History  . [employer] Freight forwarder    Social History Main Topics  . Smoking status: Current Every Day Smoker    Packs/day: 1.00    Years: 40.00  . Smokeless tobacco: Never Used  . Alcohol use 0.0 oz/week     Comment: occasionally mixed drink  . Drug use: No  . Sexual activity: Not Currently     Comment: Last sexual activity 2004.   Other Topics Concern  . Not on file   Social History Narrative   Marital status: single; not dating.  Not interested.      Children; one son (7); no grandchildren      Lives: with son.        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      Tobacco: 1 ppd x 40 years.   Quit for one month.      Alcohol: none; rarely; once per month.      Drugs: none      Exercise: none; walking 6500 steps per day in 2017.      Seatbelt:  100%      Guns: none      Sexual activity:  Not sexual activity:  Last sexual activity over ten years ago; no STDs.  Last STD testing five years ago.   Family History  Problem Relation Age of Onset  . Congestive Heart Failure Mother   . Diabetes Mother     . COPD Mother   . Atrial fibrillation Mother   . Macular degeneration Mother   . Heart disease Mother 20       CABG  . Stroke Mother 62       brain aneurysm in 87  . Aneurysm Mother   . Breast cancer Sister 58       genetic testing negative  . Heart disease Sister        atrial fibrillation  . Cancer Sister 27       breast cancer  . Alcohol abuse Brother   . COPD Brother   . Congestive Heart Failure Brother   . Diabetes Brother   . Heart disease Brother        atrial fibrllation  . Hepatitis C Brother   . COPD Brother   . Atrial fibrillation Brother        Objective:    BP 133/84 (BP Location: Right Arm, Patient Position: Sitting, Cuff Size: Large)   Pulse 98   Temp 98.1 F (36.7 C) (Oral)   Resp 16   Ht 5\' 5"  (1.651 m)   Wt 234 lb 12.8 oz (106.5 kg)   SpO2 95%   BMI 39.07 kg/m  Physical Exam  Constitutional: She is oriented to person, place, and time. She appears well-developed and well-nourished. No distress.  HENT:  Head: Normocephalic and atraumatic.  Right Ear: External ear normal.  Left Ear: External ear normal.  Nose: Nose normal.  Mouth/Throat: Oropharynx is clear and moist.  Eyes: Conjunctivae and EOM are normal. Pupils are equal, round, and reactive to light.  Neck: Normal range of motion. Neck supple. Carotid bruit is not present. No thyromegaly present.  Cardiovascular: Normal rate, regular rhythm, normal heart sounds and intact distal pulses.  Exam reveals no gallop and no friction rub.   No murmur heard. Pulmonary/Chest: Effort normal and breath sounds normal. She has no wheezes. She has no rales.  Abdominal: Soft. Bowel sounds are normal. She exhibits no distension and no mass. There is no tenderness. There is no rebound and no guarding.  Musculoskeletal:       Right hip: Normal. She exhibits normal range of motion, normal strength and no tenderness.       Lumbar back: She exhibits pain. She exhibits normal range of motion, no tenderness, no  bony tenderness, no spasm and normal pulse.  Lymphadenopathy:    She has no cervical adenopathy.  Neurological: She is alert and oriented to person, place, and time. No cranial nerve deficit.  Skin: Skin is warm and dry. No rash noted. She is not diaphoretic. No erythema. No pallor.  Psychiatric: She has a normal mood and affect. Her behavior is normal.   Dg Lumbar Spine Complete  Result Date: 05/06/2017 CLINICAL DATA:  Back pain radiating to right hip.  EXAM: LUMBAR SPINE - COMPLETE 4+ VIEW COMPARISON:  09/22/2012 . FINDINGS: Diffuse degenerative change. No evidence of fracture or dislocation. Aortoiliac atherosclerotic vascular disease. IMPRESSION: 1.  Diffuse degenerative change.  No acute abnormality. 2. Aortoiliac atherosclerotic vascular disease. Electronically Signed   By: Marcello Moores  Register   On: 05/06/2017 09:27       Assessment & Plan:   1. Essential hypertension, benign   2. Glucose intolerance (impaired glucose tolerance)   3. Hypothyroidism, postradioiodine therapy   4. Pure hypercholesterolemia   5. Degenerative disc disease, cervical   6. Anxiety state   7. Tobacco abuse   8. Acute right-sided low back pain without sciatica   9. Degenerative disc disease, lumbar    -controlled hypertension, hypothyroidism, and hypercholesterolemia; obtain labs; continue current medications. -acutely worsening lower back pain; obtained LS spine films; treat with Flexeril and Meloxicam; home exercise program provided to perform daily. If no improvement in 4 weeks, call for physical therapy and/or ortho referral. -highly recommend smoking cessation. -highly recommend exercise and weight loss.   Orders Placed This Encounter  Procedures  . DG Lumbar Spine Complete    Standing Status:   Future    Number of Occurrences:   1    Standing Expiration Date:   05/06/2018    Order Specific Question:   Reason for Exam (SYMPTOM  OR DIAGNOSIS REQUIRED)    Answer:   R sided lower back pain radiating  into R hip    Order Specific Question:   Is the patient pregnant?    Answer:   No    Order Specific Question:   Preferred imaging location?    Answer:   External  . CBC with Differential/Platelet  . Comprehensive metabolic panel    Order Specific Question:   Has the patient fasted?    Answer:   Yes  . Hemoglobin A1c  . Lipid panel    Order Specific Question:   Has the patient fasted?    Answer:   Yes  . T4, free  . TSH   Meds ordered this encounter  Medications  . ALPRAZolam (XANAX) 0.5 MG tablet    Sig: TAKE ONE TABLET BY MOUTH AT BEDTIME AS NEEDED FOR ANXIETY    Dispense:  30 tablet    Refill:  5  . cyclobenzaprine (FLEXERIL) 10 MG tablet    Sig: Take 1 tablet (10 mg total) by mouth 3 (three) times daily as needed.    Dispense:  180 tablet    Refill:  1  . meloxicam (MOBIC) 7.5 MG tablet    Sig: Take 1 tablet (7.5 mg total) by mouth daily.    Dispense:  30 tablet    Refill:  0    Return in about 6 months (around 11/06/2017) for complete physical examiniation.   Braxson Hollingsworth Elayne Guerin, M.D. Primary Care at [facility] previously [facility] [place] [address] [place], [place]  [address] [phone] phone [phone] fax

## 2017-05-07 LAB — COMPREHENSIVE METABOLIC PANEL
A/G RATIO: 1.4 (ref 1.2–2.2)
ALK PHOS: 68 IU/L (ref 39–117)
ALT: 18 IU/L (ref 0–32)
AST: 19 IU/L (ref 0–40)
Albumin: 4.2 g/dL (ref 3.5–5.5)
BUN / CREAT RATIO: 20 (ref 9–23)
BUN: 16 mg/dL (ref 6–24)
Bilirubin Total: 0.2 mg/dL (ref 0.0–1.2)
CO2: 21 mmol/L (ref 18–29)
CREATININE: 0.81 mg/dL (ref 0.57–1.00)
Calcium: 9 mg/dL (ref 8.7–10.2)
Chloride: 102 mmol/L (ref 96–106)
GFR calc Af Amer: 93 mL/min/{1.73_m2} (ref >59)
GFR calc non Af Amer: 81 mL/min/{1.73_m2} (ref >59)
GLOBULIN, TOTAL: 3 g/dL (ref 1.5–4.5)
Glucose: 98 mg/dL (ref 65–99)
Potassium: 4.7 mmol/L (ref 3.5–5.2)
SODIUM: 139 mmol/L (ref 134–144)
Total Protein: 7.2 g/dL (ref 6.0–8.5)

## 2017-05-07 LAB — LIPID PANEL
CHOL/HDL RATIO: 3.5 ratio (ref 0.0–4.4)
Cholesterol, Total: 149 mg/dL (ref 100–199)
HDL: 43 mg/dL (ref >39)
LDL CALC: 80 mg/dL (ref 0–99)
TRIGLYCERIDES: 131 mg/dL (ref 0–149)
VLDL CHOLESTEROL CAL: 26 mg/dL (ref 5–40)

## 2017-05-07 LAB — CBC WITH DIFFERENTIAL/PLATELET
BASOS: 0 %
Basophils Absolute: 0 10*3/uL (ref 0.0–0.2)
EOS (ABSOLUTE): 0.1 10*3/uL (ref 0.0–0.4)
EOS: 1 %
HEMATOCRIT: 45.7 % (ref 34.0–46.6)
HEMOGLOBIN: 15 g/dL (ref 11.1–15.9)
Immature Grans (Abs): 0 10*3/uL (ref 0.0–0.1)
Immature Granulocytes: 0 %
LYMPHS ABS: 2.1 10*3/uL (ref 0.7–3.1)
Lymphs: 37 %
MCH: 30.1 pg (ref 26.6–33.0)
MCHC: 32.8 g/dL (ref 31.5–35.7)
MCV: 92 fL (ref 79–97)
MONOCYTES: 6 %
MONOS ABS: 0.3 10*3/uL (ref 0.1–0.9)
NEUTROS ABS: 3.2 10*3/uL (ref 1.4–7.0)
Neutrophils: 56 %
Platelets: 240 10*3/uL (ref 150–379)
RBC: 4.98 x10E6/uL (ref 3.77–5.28)
RDW: 14.1 % (ref 12.3–15.4)
WBC: 5.6 10*3/uL (ref 3.4–10.8)

## 2017-05-07 LAB — HEMOGLOBIN A1C
Est. average glucose Bld gHb Est-mCnc: 117 mg/dL
Hgb A1c MFr Bld: 5.7 % — ABNORMAL HIGH (ref 4.8–5.6)

## 2017-05-07 LAB — TSH: TSH: 3.61 u[IU]/mL (ref 0.450–4.500)

## 2017-05-07 LAB — T4, FREE: Free T4: 1.4 ng/dL (ref 0.82–1.77)

## 2017-05-08 ENCOUNTER — Other Ambulatory Visit: Payer: Self-pay | Admitting: Family Medicine

## 2017-05-08 DIAGNOSIS — Z1231 Encounter for screening mammogram for malignant neoplasm of breast: Secondary | ICD-10-CM

## 2017-05-12 ENCOUNTER — Telehealth: Payer: Self-pay | Admitting: Family Medicine

## 2017-05-12 NOTE — Telephone Encounter (Signed)
Pt is calling to get the results of her x ray that were on the 25th of May.  Please advise [phone]

## 2017-05-12 NOTE — Telephone Encounter (Signed)
Please see report and advise.

## 2017-05-15 NOTE — Telephone Encounter (Signed)
Call --- low back xray shows diffuse degenerative changes.  Recommend current treatment plan.

## 2017-05-22 ENCOUNTER — Ambulatory Visit
Admission: RE | Admit: 2017-05-22 | Discharge: 2017-05-22 | Disposition: A | Discharge status: Home / Self Care | Payer: 59 | Source: Ambulatory Visit | Attending: Family Medicine | Admitting: Family Medicine

## 2017-05-22 DIAGNOSIS — Z1231 Encounter for screening mammogram for malignant neoplasm of breast: Secondary | ICD-10-CM | POA: Diagnosis present

## 2017-10-02 ENCOUNTER — Other Ambulatory Visit: Payer: Self-pay | Admitting: Family Medicine

## 2017-10-06 NOTE — Telephone Encounter (Signed)
Please advise dgaddy

## 2017-10-23 ENCOUNTER — Telehealth: Payer: Self-pay | Admitting: Family Medicine

## 2017-10-23 NOTE — Telephone Encounter (Signed)
Copied from [place] [id number]. Topic: Quick Communication - See Telephone Encounter >> Oct 23, 2017 10:38 AM Cleaster Corin, NT wrote: CRM for notification. See Telephone encounter for:   10/23/17. Pt. Beth Hughes  Will be sending a fax for approval of stand up desk for work please call pt. Once received. Let pt. Know about turn around time

## 2017-10-27 NOTE — Telephone Encounter (Signed)
Got patients fax on 10/27/17 will place the blank Accommodation forms in Dr Thompson Caul box for her to complete for the patient. The patient does have an apt with Dr Tamala Julian on 11/10/17 if you need to hold the forms till then to complete.  Please return the forms to the FMLA/Disability desk at the 104 office within 5-7 business days. Thank you  If you want to wait till her apt on 12/3 let me know so I can call the [employer] and get her time extended.

## 2017-10-28 ENCOUNTER — Other Ambulatory Visit: Payer: Self-pay | Admitting: Family Medicine

## 2017-10-28 DIAGNOSIS — F411 Generalized anxiety disorder: Secondary | ICD-10-CM

## 2017-10-28 NOTE — Telephone Encounter (Signed)
Please advise 

## 2017-10-28 NOTE — Telephone Encounter (Signed)
Xanax refill 

## 2017-10-29 NOTE — Telephone Encounter (Signed)
Forms are in Dr. Tamala Julian box for her review and signature.

## 2017-11-03 NOTE — Telephone Encounter (Signed)
Have these forms been completed--today is the 5th business day

## 2017-11-04 ENCOUNTER — Ambulatory Visit: Payer: 59 | Admitting: Family Medicine

## 2017-11-04 NOTE — Telephone Encounter (Signed)
Pt called - she is checking to see if paperwork is ready. She will be in to see provider on Monday and will be anticipating to pick it up.

## 2017-11-04 NOTE — Telephone Encounter (Signed)
Do you have these? If not please contact the patient and update.

## 2017-11-05 NOTE — Telephone Encounter (Signed)
These forms have not been completed, once they are they need to be returned to the FMLA/Disability desk so we can finish processing them before the patient is given a copy

## 2017-11-10 ENCOUNTER — Encounter: Payer: Self-pay | Admitting: Family Medicine

## 2017-11-10 ENCOUNTER — Ambulatory Visit (INDEPENDENT_AMBULATORY_CARE_PROVIDER_SITE_OTHER): Payer: 59 | Admitting: Family Medicine

## 2017-11-10 ENCOUNTER — Other Ambulatory Visit: Payer: Self-pay

## 2017-11-10 VITALS — BP 128/72 | HR 108 | Temp 98.0°F | Resp 16 | Ht 65.75 in | Wt 242.0 lb

## 2017-11-10 DIAGNOSIS — Z803 Family history of malignant neoplasm of breast: Secondary | ICD-10-CM

## 2017-11-10 DIAGNOSIS — R7302 Impaired glucose tolerance (oral): Secondary | ICD-10-CM | POA: Diagnosis not present

## 2017-11-10 DIAGNOSIS — I6523 Occlusion and stenosis of bilateral carotid arteries: Secondary | ICD-10-CM | POA: Diagnosis not present

## 2017-11-10 DIAGNOSIS — E7800 Pure hypercholesterolemia, unspecified: Secondary | ICD-10-CM | POA: Diagnosis not present

## 2017-11-10 DIAGNOSIS — M5030 Other cervical disc degeneration, unspecified cervical region: Secondary | ICD-10-CM | POA: Diagnosis not present

## 2017-11-10 DIAGNOSIS — E890 Postprocedural hypothyroidism: Secondary | ICD-10-CM

## 2017-11-10 DIAGNOSIS — G4700 Insomnia, unspecified: Secondary | ICD-10-CM

## 2017-11-10 DIAGNOSIS — F411 Generalized anxiety disorder: Secondary | ICD-10-CM

## 2017-11-10 DIAGNOSIS — M5136 Other intervertebral disc degeneration, lumbar region: Secondary | ICD-10-CM

## 2017-11-10 DIAGNOSIS — G4730 Sleep apnea, unspecified: Secondary | ICD-10-CM

## 2017-11-10 DIAGNOSIS — Z720 Tobacco use: Secondary | ICD-10-CM

## 2017-11-10 DIAGNOSIS — I10 Essential (primary) hypertension: Secondary | ICD-10-CM

## 2017-11-10 DIAGNOSIS — Z0000 Encounter for general adult medical examination without abnormal findings: Secondary | ICD-10-CM

## 2017-11-10 DIAGNOSIS — E669 Obesity, unspecified: Secondary | ICD-10-CM

## 2017-11-10 DIAGNOSIS — Z23 Encounter for immunization: Secondary | ICD-10-CM

## 2017-11-10 LAB — POCT URINALYSIS DIP (MANUAL ENTRY)
BILIRUBIN UA: NEGATIVE
GLUCOSE UA: NEGATIVE mg/dL
Ketones: NEGATIVE
Leukocytes, UA: NEGATIVE
Nitrite, UA: NEGATIVE
Protein Ur, POC: NEGATIVE mg/dL
SPEC GRAV UA: 1.02 (ref 1.010–1.025)
UROBILINOGEN UA: 0.2 U/dL
pH, UA: 7 (ref 5.0–8.0)

## 2017-11-10 MED ORDER — VARENICLINE TARTRATE 1 MG PO TABS: 1.0000 mg | ORAL_TABLET | Freq: Two times a day (BID) | ORAL | 2 refills | Status: AC

## 2017-11-10 MED ORDER — LISINOPRIL-HYDROCHLOROTHIAZIDE 10-12.5 MG PO TABS: 1.0000 | ORAL_TABLET | Freq: Every day | ORAL | 3 refills | Status: AC

## 2017-11-10 MED ORDER — VARENICLINE TARTRATE 0.5 MG X 11 & 1 MG X 42 PO MISC: ORAL | 0 refills | Status: DC

## 2017-11-10 MED ORDER — VARENICLINE TARTRATE 0.5 MG X 11 & 1 MG X 42 PO MISC: ORAL | 0 refills | Status: AC

## 2017-11-10 MED ORDER — ALPRAZOLAM 0.5 MG PO TABS: ORAL_TABLET | ORAL | 0 refills | Status: DC

## 2017-11-10 MED ORDER — CYCLOBENZAPRINE HCL 10 MG PO TABS: 10.0000 mg | ORAL_TABLET | Freq: Three times a day (TID) | ORAL | 1 refills | Status: DC | PRN

## 2017-11-10 MED ORDER — LEVOTHYROXINE SODIUM 100 MCG PO TABS: ORAL_TABLET | ORAL | 3 refills | Status: AC

## 2017-11-10 MED ORDER — LOVASTATIN 40 MG PO TABS: 40.0000 mg | ORAL_TABLET | Freq: Every day | ORAL | 3 refills | Status: AC

## 2017-11-10 NOTE — Progress Notes (Signed)
Subjective:    Patient ID: Beth Hughes, female    DOB: [birth year], 56 y.o.   MRN: [id number]  11/10/2017  Hypertension (6 month follow-up ) and Anxiety    HPI This 56 y.o. female presents for Complete Physical examination.  Last physical:  11-05-2017 Pap smear:  hysterectomy Mammogram:  05-22-2017 Colonoscopy:  04-12-2010  Iftikhar.  5 mm polyp hyperplastic.  Repeat in 2021. Eye exam:  2017; glasses; no glaucoma. Dental exam:  Dentures.  BP Readings from Last 3 Encounters:  11/10/17 128/72  05/06/17 133/84  11/05/16 130/82   Wt Readings from Last 3 Encounters:  11/10/17 242 lb (109.8 kg)  05/06/17 234 lb 12.8 oz (106.5 kg)  11/05/16 230 lb (104.3 kg)   Immunization History  Administered Date(s) Administered  . Influenza Split 10/22/2013, 10/18/2015  . Influenza,inj,Quad PF,6+ Mos 10/19/2014, 11/05/2016, 11/10/2017  . Pneumococcal Polysaccharide-23 04/23/2011  . Tdap 04/23/2011   DDD lumbar spine and cervical spine: requesting paperwork for work to receive a standing desk.  Cannot sit all day at work.  Needs formal paperwork to support purchase of new standing desk.   Review of Systems  Constitutional: Negative for activity change, appetite change, chills, diaphoresis, fatigue, fever and unexpected weight change.  HENT: Negative for congestion, dental problem, drooling, ear discharge, ear pain, facial swelling, hearing loss, mouth sores, nosebleeds, postnasal drip, rhinorrhea, sinus pressure, sneezing, sore throat, tinnitus, trouble swallowing and voice change.   Eyes: Negative for photophobia, pain, discharge, redness, itching and visual disturbance.  Respiratory: Negative for apnea, cough, choking, chest tightness, shortness of breath, wheezing and stridor.   Cardiovascular: Negative for chest pain, palpitations and leg swelling.  Gastrointestinal: Negative for abdominal distention, abdominal pain, anal bleeding, blood in stool, constipation, diarrhea, nausea, rectal  pain and vomiting.  Endocrine: Negative for cold intolerance, heat intolerance, polydipsia, polyphagia and polyuria.  Genitourinary: Negative for decreased urine volume, difficulty urinating, dyspareunia, dysuria, enuresis, flank pain, frequency, genital sores, hematuria, menstrual problem, pelvic pain, urgency, vaginal bleeding, vaginal discharge and vaginal pain.  Musculoskeletal: Positive for back pain, neck pain and neck stiffness. Negative for arthralgias, gait problem, joint swelling and myalgias.  Skin: Negative for color change, pallor, rash and wound.  Allergic/Immunologic: Negative for environmental allergies, food allergies and immunocompromised state.  Neurological: Negative for dizziness, tremors, seizures, syncope, facial asymmetry, speech difficulty, weakness, light-headedness, numbness and headaches.  Hematological: Negative for adenopathy. Does not bruise/bleed easily.  Psychiatric/Behavioral: Positive for sleep disturbance. Negative for agitation, behavioral problems, confusion, decreased concentration, dysphoric mood, hallucinations, self-injury and suicidal ideas. The patient is nervous/anxious. The patient is not hyperactive.     Past Medical History:  Diagnosis Date  . Abnormal LFTs (liver function tests)   . Anxiety   . Hypertension   . Insomnia, unspecified   . Other and unspecified hyperlipidemia   . Snoring 10/21/2014  . Tobacco use disorder   . Unspecified hypothyroidism    Past Surgical History:  Procedure Laterality Date  . ABDOMINAL HYSTERECTOMY  12/09/1998   ovarian cysts; B ooophorectomy.  No  cervical dysplasia.  Rosenow.  . COLONOSCOPY W/ POLYPECTOMY  12/09/2008   colon polyps. Iftikhar.  Repeat in 5 years.   Allergies  Allergen Reactions  . Penicillins     Blacked out  . Pseudoephedrine     Heart races, seizures   Current Outpatient Medications on File Prior to Visit  Medication Sig Dispense Refill  . aspirin EC 81 MG tablet Take 1 tablet (81 mg  total) by mouth daily.  1 tablet 0   No current facility-administered medications on file prior to visit.    Social History   Socioeconomic History  . Marital status: Single    Spouse name: Not on file  . Number of children: 1  . Years of education: Not on file  . Highest education level: Not on file  Social Needs  . Financial resource strain: Not on file  . Food insecurity - worry: Not on file  . Food insecurity - inability: Not on file  . Transportation needs - medical: Not on file  . Transportation needs - non-medical: Not on file  Occupational History  . Occupation: Arts administrator  Tobacco Use  . Smoking status: Current Every Day Smoker    Packs/day: 1.00    Years: 40.00    Pack years: 40.00  . Smokeless tobacco: Never Used  Substance and Sexual Activity  . Alcohol use: Yes    Alcohol/week: 0.0 oz    Comment: occasionally mixed drink  . Drug use: No  . Sexual activity: Not Currently    Comment: Last sexual activity 2004.  Other Topics Concern  . Not on file  Social History Narrative   Marital status: single; not dating.  Not interested.      Children; one son (64); no grandchildren      Lives: with son.        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      Tobacco: 1 ppd x 40 years.   Quit for one month.      Alcohol: none; rarely; once per month.      Drugs: none      Exercise: none; walking 6500 steps per day in 2017.      Seatbelt:  100%      Guns: none      Sexual activity:  Not sexual activity:  Last sexual activity over ten years ago; no STDs.  Last STD testing five years ago.   Family History  Problem Relation Age of Onset  . Congestive Heart Failure Mother   . Diabetes Mother   . COPD Mother   . Atrial fibrillation Mother   . Macular degeneration Mother   . Heart disease Mother 23       CABG  . Stroke Mother 77       brain aneurysm in 25  . Aneurysm Mother   . Breast cancer Sister 68       genetic testing negative    . Heart disease Sister        atrial fibrillation  . Cancer Sister 58       breast cancer  . Alcohol abuse Brother   . COPD Brother   . Congestive Heart Failure Brother   . Diabetes Brother   . Heart disease Brother        atrial fibrllation  . Hepatitis C Brother   . COPD Brother   . Atrial fibrillation Brother        Objective:    BP 128/72   Pulse (!) 108   Temp 98 F (36.7 C) (Oral)   Resp 16   Ht 5' 5.75" (1.67 m)   Wt 242 lb (109.8 kg)   SpO2 95%   BMI 39.36 kg/m  Physical Exam  Constitutional: She is oriented to person, place, and time. She appears well-developed and well-nourished. No distress.  HENT:  Head: Normocephalic and atraumatic.  Right Ear: External ear normal.  Left Ear: External  ear normal.  Nose: Nose normal.  Mouth/Throat: Oropharynx is clear and moist.  Eyes: Conjunctivae and EOM are normal. Pupils are equal, round, and reactive to light.  Neck: Normal range of motion and full passive range of motion without pain. Neck supple. No JVD present. Carotid bruit is not present. No thyromegaly present.  Cardiovascular: Normal rate, regular rhythm and normal heart sounds. Exam reveals no gallop and no friction rub.  No murmur heard. Pulmonary/Chest: Effort normal and breath sounds normal. She has no wheezes. She has no rales.  Abdominal: Soft. Bowel sounds are normal. She exhibits no distension and no mass. There is no tenderness. There is no rebound and no guarding.  Musculoskeletal:       Right shoulder: Normal.       Left shoulder: Normal.       Cervical back: Normal.  Lymphadenopathy:    She has no cervical adenopathy.  Neurological: She is alert and oriented to person, place, and time. She has normal reflexes. No cranial nerve deficit. She exhibits normal muscle tone. Coordination normal.  Skin: Skin is warm and dry. No rash noted. She is not diaphoretic. No erythema. No pallor.  Psychiatric: She has a normal mood and affect. Her behavior is  normal. Judgment and thought content normal.  Nursing note and vitals reviewed.  No results found. Depression screen [facility] 2/9 11/10/2017 05/06/2017 11/05/2016 04/23/2016 10/20/2015  Decreased Interest 0 0 0 0 0  Down, Depressed, Hopeless 0 0 0 0 0  PHQ - 2 Score 0 0 0 0 0   Fall Risk  11/10/2017 05/06/2017 11/05/2016 04/23/2016 10/20/2015  Falls in the past year? No No No No No        Assessment & Plan:   1. Routine physical examination   2. Essential hypertension, benign   3. Glucose intolerance (impaired glucose tolerance)   4. Bilateral carotid artery stenosis   5. Hypothyroidism, postradioiodine therapy   6. Degenerative disc disease, cervical   7. Degenerative disc disease, lumbar   8. Anxiety state   9. Family history of breast cancer in sister   38. Insomnia with sleep apnea   11. Obesity, Class II, BMI 35-39.9   12. Pure hypercholesterolemia   13. Tobacco abuse   14. Need for prophylactic vaccination and inoculation against influenza     -anticipatory guidance provided --- exercise, weight loss, safe driving practices, aspirin 81mg  daily. -obtain age appropriate screening labs and labs for chronic disease management. -will complete breast exam at next visit. -agreeable to completing forms for DDD lumbar spine for standing desk. -refills provided. -highly encourage smoking cessation.   Counseling provided; Chantix rx provide dand counseled regarding side effects. -recommend weight loss, exercise for 30-60 minutes five days per week; recommend 1200 kcal restriction per day with a minimum of 60 grams of protein per day.    Orders Placed This Encounter  Procedures  . Flu Vaccine QUAD 36+ mos IM  . Comprehensive metabolic panel    Order Specific Question:   Has the patient fasted?    Answer:   No  . CBC with Differential/Platelet  . Hemoglobin A1c  . Lipid panel    Order Specific Question:   Has the patient fasted?    Answer:   No  . TSH  . T4, free  . POCT  urinalysis dipstick   Meds ordered this encounter  Medications  . varenicline (CHANTIX STARTING MONTH PAK) 0.5 MG X 11 & 1 MG X 42 tablet  Sig: Take one 0.5 mg tablet by mouth once daily for 3 days, then increase to one 0.5 mg tablet twice daily for 4 days, then increase to one 1 mg tablet twice daily.    Dispense:  53 tablet    Refill:  0  . varenicline (CHANTIX CONTINUING MONTH PAK) 1 MG tablet    Sig: Take 1 tablet (1 mg total) by mouth 2 (two) times daily.    Dispense:  60 tablet    Refill:  2  . lisinopril-hydrochlorothiazide (PRINZIDE,ZESTORETIC) 10-12.5 MG tablet    Sig: Take 1 tablet by mouth daily.    Dispense:  90 tablet    Refill:  3  . levothyroxine (SYNTHROID, LEVOTHROID) 100 MCG tablet    Sig: Take one tablet by mouth daily on every day, except on Tuesdays and Thursdays take 1 1/2 tablets.    Dispense:  105 tablet    Refill:  3  . cyclobenzaprine (FLEXERIL) 10 MG tablet    Sig: Take 1 tablet (10 mg total) by mouth 3 (three) times daily as needed.    Dispense:  180 tablet    Refill:  1    Please consider 90 day supplies to promote better adherence  . ALPRAZolam (XANAX) 0.5 MG tablet    Sig: TAKE 1 TABLET BY MOUTH AT BEDTIME AS NEEDED FOR ANXIETY    Dispense:  30 tablet    Refill:  0  . lovastatin (MEVACOR) 40 MG tablet    Sig: Take 1 tablet (40 mg total) by mouth at bedtime.    Dispense:  90 tablet    Refill:  3    Return in about 6 months (around 05/11/2018) for follow-up chronic medical conditions.   Aaditya Letizia Elayne Guerin, M.D. Primary Care at [facility] previously [facility] [place] [address] [place], [place]  [address] [phone] phone [phone] fax

## 2017-11-10 NOTE — Patient Instructions (Addendum)
IF you received an x-ray today, you will receive an invoice from [facility]. Please contact [facility] at [phone] with questions or concerns regarding your invoice.   IF you received labwork today, you will receive an invoice from [place]. Please contact [facility] at [phone] with questions or concerns regarding your invoice.   Our billing staff will not be able to assist you with questions regarding bills from these companies.  You will be contacted with the lab results as soon as they are available. The fastest way to get your results is to activate your My Chart account. Instructions are located on the last page of this paperwork. If you have not heard from [place] regarding the results in 2 weeks, please contact this office.      Preventive Care 40-64 Years, Female Preventive care refers to lifestyle choices and visits with your health care provider that can promote health and wellness. What does preventive care include?  A yearly physical exam. This is also called an annual well check.  Dental exams once or twice a year.  Routine eye exams. Ask your health care provider how often you should have your eyes checked.  Personal lifestyle choices, including: ? Daily care of your teeth and gums. ? Regular physical activity. ? Eating a healthy diet. ? Avoiding tobacco and drug use. ? Limiting alcohol use. ? Practicing safe sex. ? Taking low-dose aspirin daily starting at age 3. ? Taking vitamin and mineral supplements as recommended by your health care provider. What happens during an annual well check? The services and screenings done by your health care provider during your annual well check will depend on your age, overall health, lifestyle risk factors, and family history of disease. Counseling Your health care provider may ask you questions about your:  Alcohol use.  Tobacco use.  Drug use.  Emotional well-being.  Home and relationship  well-being.  Sexual activity.  Eating habits.  Work and work Statistician.  Method of birth control.  Menstrual cycle.  Pregnancy history.  Screening You may have the following tests or measurements:  Height, weight, and BMI.  Blood pressure.  Lipid and cholesterol levels. These may be checked every 5 years, or more frequently if you are over 84 years old.  Skin check.  Lung cancer screening. You may have this screening every year starting at age 50 if you have a 30-pack-year history of smoking and currently smoke or have quit within the past 15 years.  Fecal occult blood test (FOBT) of the stool. You may have this test every year starting at age 35.  Flexible sigmoidoscopy or colonoscopy. You may have a sigmoidoscopy every 5 years or a colonoscopy every 10 years starting at age 64.  Hepatitis C blood test.  Hepatitis B blood test.  Sexually transmitted disease (STD) testing.  Diabetes screening. This is done by checking your blood sugar (glucose) after you have not eaten for a while (fasting). You may have this done every 1-3 years.  Mammogram. This may be done every 1-2 years. Talk to your health care provider about when you should start having regular mammograms. This may depend on whether you have a family history of breast cancer.  BRCA-related cancer screening. This may be done if you have a family history of breast, ovarian, tubal, or peritoneal cancers.  Pelvic exam and Pap test. This may be done every 3 years starting at age 27. Starting at age 37, this may be done every 5 years if  you have a Pap test in combination with an HPV test.  Bone density scan. This is done to screen for osteoporosis. You may have this scan if you are at high risk for osteoporosis.  Discuss your test results, treatment options, and if necessary, the need for more tests with your health care provider. Vaccines Your health care provider may recommend certain vaccines, such  as:  Influenza vaccine. This is recommended every year.  Tetanus, diphtheria, and acellular pertussis (Tdap, Td) vaccine. You may need a Td booster every 10 years.  Varicella vaccine. You may need this if you have not been vaccinated.  Zoster vaccine. You may need this after age 10.  Measles, mumps, and rubella (MMR) vaccine. You may need at least one dose of MMR if you were born in 1957 or later. You may also need a second dose.  Pneumococcal 13-valent conjugate (PCV13) vaccine. You may need this if you have certain conditions and were not previously vaccinated.  Pneumococcal polysaccharide (PPSV23) vaccine. You may need one or two doses if you smoke cigarettes or if you have certain conditions.  Meningococcal vaccine. You may need this if you have certain conditions.  Hepatitis A vaccine. You may need this if you have certain conditions or if you travel or work in places where you may be exposed to hepatitis A.  Hepatitis B vaccine. You may need this if you have certain conditions or if you travel or work in places where you may be exposed to hepatitis B.  Haemophilus influenzae type b (Hib) vaccine. You may need this if you have certain conditions.  Talk to your health care provider about which screenings and vaccines you need and how often you need them. This information is not intended to replace advice given to you by your health care provider. Make sure you discuss any questions you have with your health care provider. Document Released: 12/22/2015 Document Revised: 08/14/2016 Document Reviewed: 09/26/2015 Elsevier Interactive Patient Education  2017 [employer].

## 2017-11-11 LAB — CBC WITH DIFFERENTIAL/PLATELET
Basophils Absolute: 0 10*3/uL (ref 0.0–0.2)
Basos: 0 %
EOS (ABSOLUTE): 0.1 10*3/uL (ref 0.0–0.4)
Eos: 1 %
Hematocrit: 45.6 % (ref 34.0–46.6)
Hemoglobin: 15.4 g/dL (ref 11.1–15.9)
IMMATURE GRANULOCYTES: 0 %
Immature Grans (Abs): 0 10*3/uL (ref 0.0–0.1)
Lymphocytes Absolute: 1.9 10*3/uL (ref 0.7–3.1)
Lymphs: 39 %
MCH: 30.5 pg (ref 26.6–33.0)
MCHC: 33.8 g/dL (ref 31.5–35.7)
MCV: 90 fL (ref 79–97)
MONOS ABS: 0.4 10*3/uL (ref 0.1–0.9)
Monocytes: 8 %
NEUTROS PCT: 52 %
Neutrophils Absolute: 2.5 10*3/uL (ref 1.4–7.0)
Platelets: 241 10*3/uL (ref 150–379)
RBC: 5.05 x10E6/uL (ref 3.77–5.28)
RDW: 14.1 % (ref 12.3–15.4)
WBC: 4.8 10*3/uL (ref 3.4–10.8)

## 2017-11-11 LAB — COMPREHENSIVE METABOLIC PANEL
A/G RATIO: 1.6 (ref 1.2–2.2)
ALK PHOS: 74 IU/L (ref 39–117)
ALT: 17 IU/L (ref 0–32)
AST: 19 IU/L (ref 0–40)
Albumin: 4.5 g/dL (ref 3.5–5.5)
BUN/Creatinine Ratio: 18 (ref 9–23)
BUN: 15 mg/dL (ref 6–24)
Bilirubin Total: 0.4 mg/dL (ref 0.0–1.2)
CO2: 20 mmol/L (ref 20–29)
CREATININE: 0.83 mg/dL (ref 0.57–1.00)
Calcium: 9.4 mg/dL (ref 8.7–10.2)
Chloride: 103 mmol/L (ref 96–106)
GFR calc Af Amer: 90 mL/min/{1.73_m2} (ref >59)
GFR calc non Af Amer: 78 mL/min/{1.73_m2} (ref >59)
GLOBULIN, TOTAL: 2.8 g/dL (ref 1.5–4.5)
Glucose: 94 mg/dL (ref 65–99)
POTASSIUM: 4.5 mmol/L (ref 3.5–5.2)
SODIUM: 140 mmol/L (ref 134–144)
Total Protein: 7.3 g/dL (ref 6.0–8.5)

## 2017-11-11 LAB — LIPID PANEL
CHOL/HDL RATIO: 3.5 ratio (ref 0.0–4.4)
Cholesterol, Total: 147 mg/dL (ref 100–199)
HDL: 42 mg/dL (ref >39)
LDL Calculated: 82 mg/dL (ref 0–99)
Triglycerides: 113 mg/dL (ref 0–149)
VLDL CHOLESTEROL CAL: 23 mg/dL (ref 5–40)

## 2017-11-11 LAB — HEMOGLOBIN A1C
Est. average glucose Bld gHb Est-mCnc: 114 mg/dL
HEMOGLOBIN A1C: 5.6 % (ref 4.8–5.6)

## 2017-11-11 LAB — T4, FREE: Free T4: 1.21 ng/dL (ref 0.82–1.77)

## 2017-11-11 LAB — TSH: TSH: 4.19 u[IU]/mL (ref 0.450–4.500)

## 2017-11-11 NOTE — Telephone Encounter (Signed)
Pt was seen in the office yesterday and paperwork completed.

## 2017-11-12 NOTE — Telephone Encounter (Signed)
Do we have a copy of these forms because we have to scan them into the chart so we have a records of them.

## 2017-11-12 NOTE — Telephone Encounter (Signed)
Medical records do you have these forms in anything upstairs? If so I need them.   Let me know--thanks

## 2017-11-12 NOTE — Telephone Encounter (Signed)
Copied from [place] [phone]. Topic: Quick Communication - See Telephone Encounter >> Oct 23, 2017 10:38 AM Cleaster Corin, NT wrote: CRM for notification. See Telephone encounter for:   10/23/17. Pt. Beth Hughes  Will be sending a fax for approval of stand up desk for work please call pt. Once received. Let pt. Know about turn around time   >> Nov 12, 2017  9:56 AM Oneta Rack wrote: Relation to pt: self Call back number: [phone]    Reason for call:  Patient checking on the status of form dropped off with provider, patient states she spoke with employer today and form has not been received, regarding stand up desk. patient would like form fax to the # at the bottom of form, please advise

## 2017-11-13 NOTE — Telephone Encounter (Signed)
I've been going through the batch stuff and I haven't seen anything for this patient.Marland KitchenMarland Kitchen

## 2017-11-14 ENCOUNTER — Telehealth: Payer: Self-pay | Admitting: Family Medicine

## 2017-11-14 NOTE — Telephone Encounter (Signed)
So would someone please call this patient back and let her know about her paperwork.  She has called every day this week without a response from [place].  [phone]

## 2017-11-19 NOTE — Telephone Encounter (Signed)
Completed and faxed on 12/7

## 2017-11-20 NOTE — Telephone Encounter (Signed)
Please advise 

## 2017-11-27 NOTE — Telephone Encounter (Signed)
Spoke with pt to confirm paper work was received  processed, she informed me that they have received her paper work and that it is being processed.

## 2017-12-28 ENCOUNTER — Other Ambulatory Visit: Payer: Self-pay | Admitting: Family Medicine

## 2017-12-28 DIAGNOSIS — F411 Generalized anxiety disorder: Secondary | ICD-10-CM

## 2017-12-29 ENCOUNTER — Other Ambulatory Visit: Payer: Self-pay | Admitting: Family Medicine

## 2017-12-29 DIAGNOSIS — F411 Generalized anxiety disorder: Secondary | ICD-10-CM

## 2017-12-29 NOTE — Telephone Encounter (Signed)
Controlled substance 

## 2017-12-30 MED ORDER — ALPRAZOLAM 0.5 MG PO TABS: ORAL_TABLET | ORAL | 5 refills | Status: DC

## 2018-04-28 ENCOUNTER — Other Ambulatory Visit: Payer: Self-pay | Admitting: Family Medicine

## 2018-04-29 NOTE — Telephone Encounter (Signed)
cyclobenaprine refill Last OV: 11/10/17 Last Refill:11/10/17 #180 tabs 1 RF Pharmacy:[facility] [address] [place]

## 2018-05-04 ENCOUNTER — Encounter: Payer: Self-pay | Admitting: Family Medicine

## 2018-05-11 ENCOUNTER — Encounter: Payer: Self-pay | Admitting: Family Medicine

## 2018-05-11 ENCOUNTER — Ambulatory Visit (INDEPENDENT_AMBULATORY_CARE_PROVIDER_SITE_OTHER): Payer: Managed Care, Other (non HMO)

## 2018-05-11 ENCOUNTER — Other Ambulatory Visit: Payer: Self-pay

## 2018-05-11 ENCOUNTER — Ambulatory Visit (INDEPENDENT_AMBULATORY_CARE_PROVIDER_SITE_OTHER): Payer: Managed Care, Other (non HMO) | Admitting: Family Medicine

## 2018-05-11 VITALS — BP 130/76 | HR 103 | Temp 98.0°F | Resp 16 | Ht 64.57 in | Wt 240.0 lb

## 2018-05-11 DIAGNOSIS — F411 Generalized anxiety disorder: Secondary | ICD-10-CM | POA: Diagnosis not present

## 2018-05-11 DIAGNOSIS — M25551 Pain in right hip: Secondary | ICD-10-CM | POA: Diagnosis not present

## 2018-05-11 DIAGNOSIS — R3129 Other microscopic hematuria: Secondary | ICD-10-CM | POA: Diagnosis not present

## 2018-05-11 DIAGNOSIS — I10 Essential (primary) hypertension: Secondary | ICD-10-CM | POA: Diagnosis not present

## 2018-05-11 DIAGNOSIS — M5136 Other intervertebral disc degeneration, lumbar region: Secondary | ICD-10-CM

## 2018-05-11 DIAGNOSIS — E7800 Pure hypercholesterolemia, unspecified: Secondary | ICD-10-CM

## 2018-05-11 DIAGNOSIS — E890 Postprocedural hypothyroidism: Secondary | ICD-10-CM

## 2018-05-11 DIAGNOSIS — R7302 Impaired glucose tolerance (oral): Secondary | ICD-10-CM | POA: Diagnosis not present

## 2018-05-11 LAB — POCT URINALYSIS DIP (MANUAL ENTRY)
Bilirubin, UA: NEGATIVE
GLUCOSE UA: NEGATIVE mg/dL
Ketones, POC UA: NEGATIVE mg/dL
LEUKOCYTES UA: NEGATIVE
NITRITE UA: NEGATIVE
Protein Ur, POC: NEGATIVE mg/dL
Spec Grav, UA: 1.015 (ref 1.010–1.025)
UROBILINOGEN UA: 0.2 U/dL
pH, UA: 7 (ref 5.0–8.0)

## 2018-05-11 NOTE — Progress Notes (Signed)
Subjective:    Patient ID: Beth Hughes, female    DOB: 1961-10-01, 57 y.o.   MRN: [id number]  05/11/2018  Chronic Conditions (6 month follow-up )    HPI This 57 y.o. female presents for six month follow-up of hypertension, hypercholesterolemia, hypothyroidism, anxiety, DDD cervical spine.  Management changes made at last visit include the following: anticipatory guidance provided --- exercise, weight loss, safe driving practices, aspirin 81mg  daily. -obtain age appropriate screening labs and labs for chronic disease management. -will complete breast exam at next visit. -agreeable to completing forms for DDD lumbar spine for standing desk. -refills provided. -highly encourage smoking cessation.   Counseling provided; Chantix rx provide dand counseled regarding side effects. -recommend weight loss, exercise for 30-60 minutes five days per week; recommend 1200 kcal restriction per day with a minimum of 60 grams of protein per day. -Labs look fabulous!  Everything is normal. Moderate amount of blood in urine. Recommend repeating urinalysis at next visit with microscopic exam.   UPDATE: R hip pain: onset several months ago.  No injury.  If prolonged sitting or standing, develops pain. No limp. Hurts to get up and down.  Chronic stiffness.  Flexion of R hip hurts.  Lumbar spine films 04/2017.  Not checking BP.  Feels fine.   Quit SMOKING CIGARETTES SINCE 12/08/17; SMOKES CIGAR ONCE AND A WHILE.  No chantix.  Never started it.    Microscopic hematuria:  No gross hematuria; no history of kidney stones.   BP Readings from Last 3 Encounters:  05/11/18 130/76  11/10/17 128/72  05/06/17 133/84   Wt Readings from Last 3 Encounters:  05/11/18 240 lb (108.9 kg)  11/10/17 242 lb (109.8 kg)  05/06/17 234 lb 12.8 oz (106.5 kg)   Immunization History  Administered Date(s) Administered  . Influenza Split 10/22/2013, 10/18/2015  . Influenza,inj,Quad PF,6+ Mos 10/19/2014, 11/05/2016,  11/10/2017  . Pneumococcal Polysaccharide-23 04/23/2011  . Tdap 04/23/2011    Review of Systems  Constitutional: Negative for chills, diaphoresis, fatigue and fever.  Eyes: Negative for visual disturbance.  Respiratory: Negative for cough and shortness of breath.   Cardiovascular: Negative for chest pain, palpitations and leg swelling.  Gastrointestinal: Negative for abdominal pain, constipation, diarrhea, nausea and vomiting.  Endocrine: Negative for cold intolerance, heat intolerance, polydipsia, polyphagia and polyuria.  Musculoskeletal: Positive for arthralgias and back pain. Negative for gait problem.  Neurological: Negative for dizziness, tremors, seizures, syncope, facial asymmetry, speech difficulty, weakness, light-headedness, numbness and headaches.  Psychiatric/Behavioral: Positive for sleep disturbance. Negative for dysphoric mood, self-injury and suicidal ideas. The patient is nervous/anxious.     Past Medical History:  Diagnosis Date  . Abnormal LFTs (liver function tests)   . Anxiety   . Hypertension   . Insomnia, unspecified   . Other and unspecified hyperlipidemia   . Snoring 10/21/2014  . Tobacco use disorder   . Unspecified hypothyroidism    Past Surgical History:  Procedure Laterality Date  . ABDOMINAL HYSTERECTOMY  12/09/1998   ovarian cysts; B ooophorectomy.  No  cervical dysplasia.  [place].  . COLONOSCOPY W/ POLYPECTOMY  12/09/2008   colon polyps. [place].  Repeat in 5 years.   Allergies  Allergen Reactions  . Penicillins     Blacked out  . Pseudoephedrine     Heart races, seizures   Current Outpatient Medications on File Prior to Visit  Medication Sig Dispense Refill  . ALPRAZolam (XANAX) 0.5 MG tablet TAKE 1 TABLET BY MOUTH AT BEDTIME AS NEEDED FOR ANXIETY 30  tablet 5  . aspirin EC 81 MG tablet Take 1 tablet (81 mg total) by mouth daily. 1 tablet 0  . cyclobenzaprine (FLEXERIL) 10 MG tablet TAKE 1 TABLET BY MOUTH THREE TIMES DAILY AS NEEDED 90  tablet 0  . levothyroxine (SYNTHROID, LEVOTHROID) 100 MCG tablet Take one tablet by mouth daily on every day, except on Tuesdays and Thursdays take 1 1/2 tablets. 105 tablet 3  . lisinopril-hydrochlorothiazide (PRINZIDE,ZESTORETIC) 10-12.5 MG tablet Take 1 tablet by mouth daily. 90 tablet 3  . lovastatin (MEVACOR) 40 MG tablet Take 1 tablet (40 mg total) by mouth at bedtime. 90 tablet 3  . varenicline (CHANTIX CONTINUING MONTH PAK) 1 MG tablet Take 1 tablet (1 mg total) by mouth 2 (two) times daily. 60 tablet 2  . varenicline (CHANTIX STARTING MONTH PAK) 0.5 MG X 11 & 1 MG X 42 tablet Take one 0.5 mg tablet by mouth once daily for 3 days, then increase to one 0.5 mg tablet twice daily for 4 days, then increase to one 1 mg tablet twice daily. 53 tablet 0   No current facility-administered medications on file prior to visit.    Social History   Socioeconomic History  . Marital status: Widowed    Spouse name: Not on file  . Number of children: 1  . Years of education: Not on file  . Highest education level: Not on file  Occupational History  . Occupation: Arts administrator  Social Needs  . Financial resource strain: Not on file  . Food insecurity:    Worry: Not on file    Inability: Not on file  . Transportation needs:    Medical: Not on file    Non-medical: Not on file  Tobacco Use  . Smoking status: Current Every Day Smoker    Packs/day: 1.00    Years: 40.00    Pack years: 40.00  . Smokeless tobacco: Never Used  Substance and Sexual Activity  . Alcohol use: Yes    Alcohol/week: 0.0 oz    Comment: occasionally mixed drink  . Drug use: No  . Sexual activity: Not Currently    Comment: Last sexual activity 2004.  Lifestyle  . Physical activity:    Days per week: Not on file    Minutes per session: Not on file  . Stress: Not on file  Relationships  . Social connections:    Talks on phone: Not on file    Gets together: Not on file    Attends religious service: Not on file     Active member of club or organization: Not on file    Attends meetings of clubs or organizations: Not on file    Relationship status: Not on file  . Intimate partner violence:    Fear of current or ex partner: Not on file    Emotionally abused: Not on file    Physically abused: Not on file    Forced sexual activity: Not on file  Other Topics Concern  . Not on file  Social History Narrative   Marital status: single; not dating.  Not interested.      Children; one son (22); no grandchildren      Lives: with son.        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      Tobacco: 1 ppd x 40 years.   Quit for one month.      Alcohol: none; rarely; once per month.  Drugs: none      Exercise: none; walking 6500 steps per day in 2017.      Seatbelt:  100%      Guns: none      Sexual activity:  Not sexual activity:  Last sexual activity over ten years ago; no STDs.  Last STD testing five years ago.   Family History  Problem Relation Age of Onset  . Congestive Heart Failure Mother   . Diabetes Mother   . COPD Mother   . Atrial fibrillation Mother   . Macular degeneration Mother   . Heart disease Mother 80       CABG  . Stroke Mother 56       brain aneurysm in 14  . Aneurysm Mother   . Breast cancer Sister 62       genetic testing negative  . Heart disease Sister        atrial fibrillation  . Cancer Sister 77       breast cancer  . Alcohol abuse Brother   . COPD Brother   . Congestive Heart Failure Brother   . Diabetes Brother   . Heart disease Brother        atrial fibrllation  . Hepatitis C Brother   . COPD Brother   . Atrial fibrillation Brother        Objective:    BP 130/76   Pulse (!) 103   Temp 98 F (36.7 C) (Oral)   Resp 16   Ht 5' 4.57" (1.64 m)   Wt 240 lb (108.9 kg)   SpO2 98%   BMI 40.48 kg/m  Physical Exam  Constitutional: She is oriented to person, place, and time. She appears well-developed and well-nourished. No  distress.  HENT:  Head: Normocephalic and atraumatic.  Right Ear: External ear normal.  Left Ear: External ear normal.  Nose: Nose normal.  Mouth/Throat: Oropharynx is clear and moist.  Eyes: Pupils are equal, round, and reactive to light. Conjunctivae and EOM are normal.  Neck: Normal range of motion. Neck supple. Carotid bruit is not present. No thyromegaly present.  Cardiovascular: Normal rate, regular rhythm, normal heart sounds and intact distal pulses. Exam reveals no gallop and no friction rub.  No murmur heard. Pulmonary/Chest: Effort normal and breath sounds normal. She has no wheezes. She has no rales.  Abdominal: Soft. Bowel sounds are normal. She exhibits no distension and no mass. There is no tenderness. There is no rebound and no guarding.  Musculoskeletal:       Right hip: She exhibits normal range of motion, normal strength, no tenderness, no bony tenderness, no swelling, no crepitus, no deformity and no laceration.       Lumbar back: She exhibits pain. She exhibits normal range of motion, no tenderness, no bony tenderness, no swelling, no edema, no spasm and normal pulse.  Lumbar spine:  Non-tender midline; non-tender paraspinal regions B.  Straight leg raises negative B; toe and heel walking intact; marching intact; motor 5/5 BLE.  Full ROM lumbar spine without limitation. R hip: painful ROM of hip in all directions; pain with hip flexion.  Motor 5/5.   Lymphadenopathy:    She has no cervical adenopathy.  Neurological: She is alert and oriented to person, place, and time. No cranial nerve deficit.  Skin: Skin is warm and dry. No rash noted. She is not diaphoretic. No erythema. No pallor.  Psychiatric: She has a normal mood and affect. Her behavior is normal.   No results  found. Depression screen [facility] 2/9 05/11/2018 11/10/2017 05/06/2017 11/05/2016 04/23/2016  Decreased Interest 0 0 0 0 0  Down, Depressed, Hopeless 0 0 0 0 0  PHQ - 2 Score 0 0 0 0 0   Fall Risk  05/11/2018  11/10/2017 05/06/2017 11/05/2016 04/23/2016  Falls in the past year? No No No No No   Dg Lumbar Spine Complete  Result Date: 05/11/2018 CLINICAL DATA:  Chronic low back pain.  No known injury. EXAM: LUMBAR SPINE - COMPLETE 4+ VIEW COMPARISON:  Plain films lumbar spine 09/22/2012 and 05/06/2017. FINDINGS: Vertebral body height and alignment are maintained. Intervertebral disc space height is normal. Facet joints are unremarkable. Atherosclerosis is noted. IMPRESSION: Normal appearing lumbar spine. Atherosclerosis. Electronically Signed   By: Inge Rise M.D.   On: 05/11/2018 10:23   Dg Hip Unilat W Or W/o Pelvis 2-3 Views Right  Result Date: 05/11/2018 CLINICAL DATA:  Low back and right hip pain, chronic. EXAM: DG HIP (WITH OR WITHOUT PELVIS) 2-3V RIGHT COMPARISON:  None. FINDINGS: There is no evidence of hip fracture or dislocation. There is no evidence of arthropathy or other focal bone abnormality. IMPRESSION: Normal exam. Electronically Signed   By: Inge Rise M.D.   On: 05/11/2018 10:26        Assessment & Plan:   1. Essential hypertension, benign   2. Glucose intolerance (impaired glucose tolerance)   3. Hypothyroidism, postradioiodine therapy   4. Pure hypercholesterolemia   5. Microscopic hematuria   6. Right hip pain   7. Degenerative disc disease, lumbar     Hypertension, hypercholesterolemia, glucose intolerance, post-ablative hypothyroidism:  well-controlled.  Obtain labs for chronic disease management.  No changes to therapy at this time.  Refills provided.  Microscopic hematuria: New.  Repeat urine with microscopic examination.  If persistent, refer to urology.  Denies gross hematuria.  No history of nephrolithiasis.  Right hip pain with chronic lower back pain: New onset.  Differential diagnosis includes primary right hip pathology versus degenerative disc disease of lumbar spine with radicular symptoms.  Painful range of motion of right hip on exam thus despite  etiology.  Obtain lumbar spine and hip films that are both negative.  Recommend rest, stretches, anti-inflammatories at home.  Home exercise program provided.  If no improvement in 1 month, contact provider for referral to orthopedics.  Congratulations on smoking cessation!  Orders Placed This Encounter  Procedures  . DG Lumbar Spine Complete    Standing Status:   Future    Number of Occurrences:   1    Standing Expiration Date:   05/11/2019    Order Specific Question:   Reason for Exam (SYMPTOM  OR DIAGNOSIS REQUIRED)    Answer:   lower back pain R; no injury    Order Specific Question:   Is the patient pregnant?    Answer:   No    Order Specific Question:   Preferred imaging location?    Answer:   External  . DG HIP UNILAT W OR W/O PELVIS 2-3 VIEWS RIGHT    Standing Status:   Future    Number of Occurrences:   1    Standing Expiration Date:   05/11/2019    Order Specific Question:   Reason for Exam (SYMPTOM  OR DIAGNOSIS REQUIRED)    Answer:   R posterior hip pain    Order Specific Question:   Is the patient pregnant?    Answer:   No    Order Specific Question:  Preferred imaging location?    Answer:   External  . Comprehensive metabolic panel    Order Specific Question:   Has the patient fasted?    Answer:   No  . Lipid panel    Order Specific Question:   Has the patient fasted?    Answer:   No  . CBC with Differential/Platelet  . TSH  . T4, free  . Hemoglobin A1c  . Urine Microscopic  . POCT urinalysis dipstick   No orders of the defined types were placed in this encounter.   Return in about 6 months (around 11/10/2018) for complete physical examiniation.   Kristi Elayne Guerin, M.D. Primary Care at [facility] [address] [place], [place]  [address] [phone] phone [phone] fax

## 2018-05-11 NOTE — Patient Instructions (Addendum)
IF you received an x-ray today, you will receive an invoice from [facility]. Please contact [facility] at [phone] with questions or concerns regarding your invoice.   IF you received labwork today, you will receive an invoice from [place]. Please contact [facility] at [phone] with questions or concerns regarding your invoice.   Our billing staff will not be able to assist you with questions regarding bills from these companies.  You will be contacted with the lab results as soon as they are available. The fastest way to get your results is to activate your My Chart account. Instructions are located on the last page of this paperwork. If you have not heard from [place] regarding the results in 2 weeks, please contact this office.     s Hip Exercises Ask your health care provider which exercises are safe for you. Do exercises exactly as told by your health care provider and adjust them as directed. It is normal to feel mild stretching, pulling, tightness, or discomfort as you do these exercises, but you should stop right away if you feel sudden pain or your pain gets worse.Do not begin these exercises until told by your health care provider. STRETCHING AND RANGE OF MOTION EXERCISES These exercises warm up your muscles and joints and improve the movement and flexibility of your hip. These exercises also help to relieve pain, numbness, and tingling. Exercise A: Hamstrings, Supine  1. Lie on your back. 2. Loop a belt or towel over the ball of your left / rightfoot. The ball of your foot is on the walking surface, right under your toes. 3. Straighten your left / rightknee and slowly pull on the belt to raise your leg. ? Do not let your left / right knee bend while you do this. ? Keep your other leg flat on the floor. ? Raise the left / right leg until you feel a gentle stretch behind your left / right knee or thigh. 4. Hold this position for __________  seconds. 5. Slowly return your leg to the starting position. Repeat __________ times. Complete this stretch __________ times a day. Exercise B: Hip Rotators  1. Lie on your back on a firm surface. 2. Hold your left / right knee with your left / right hand. Hold your ankle with your other hand. 3. Gently pull your left / right knee and rotate your lower leg toward your other shoulder. ? Pull until you feel a stretch in your buttocks. ? Keep your hips and shoulders firmly planted while you do this stretch. 4. Hold this position for __________ seconds. Repeat __________ times. Complete this stretch __________ times a day. Exercise C: V-Sit (Hamstrings and Adductors)  1. Sit on the floor with your legs extended in a large "V" shape. Keep your knees straight during this exercise. 2. Start with your head and chest upright, then bend at your waist to reach for your left foot (position A). You should feel a stretch in your right inner thigh. 3. Hold this position for __________ seconds. Then slowly return to the upright position. 4. Bend at your waist to reach forward (position B). You should feel a stretch behind both of your thighs and knees. 5. Hold this position for __________ seconds. Then slowly return to the upright position. 6. Bend at your waist to reach for your right foot (position C). You should feel a stretch in your left inner thigh. 7. Hold this position for __________ seconds. Then slowly return to the  upright position. Repeat __________ times. Complete this stretch __________ times a day. Exercise D: Lunge (Hip Flexors)  1. Place your left / right knee on the floor and bend your other knee so that is directly over your ankle. You should be half-kneeling. 2. Keep good posture with your head over your shoulders. 3. Tighten your buttocks to point your tailbone downward. This helps your back to keep from arching too much. 4. You should feel a gentle stretch in the front of your left /  right thigh and hip. If you do not feel any resistance, slightly slide your other foot forward and then slowly lunge forward so your knee once again lines up over your ankle. 5. Make sure your tailbone continues to point downward. 6. Hold this position for __________ seconds. Repeat __________ times. Complete this stretch __________ times a day. STRENGTHENING EXERCISES These exercises build strength and endurance in your hip. Endurance is the ability to use your muscles for a long time, even after they get tired. Exercise E: Bridge (Hip Extensors)  1. Lie on your back on a firm surface with your knees bent and your feet flat on the floor. 2. Tighten your buttocks muscles and lift your bottom off the floor until the trunk of your body is level with your thighs. ? Do not arch your back. ? You should feel the muscles working in your buttocks and the back of your thighs. If you do not feel these muscles, slide your feet 1-2 inches (2.5-5 cm) farther away from your buttocks. 3. Hold this position for __________ seconds. 4. Slowly lower your hips to the starting position. 5. Let your muscles relax completely between repetitions. 6. If this exercise is too easy, try doing it with your arms crossed over your chest. Repeat __________ times. Complete this exercise __________ times a day. Exercise F: Straight Leg Raises - Hip Abductors  1. Lie on your side with your left / right leg in the top position. Lie so your head, shoulder, knee, and hip line up with each other. You may bend your bottom knee to help you balance. 2. Roll your hips slightly forward, so your hips are stacked directly over each other and your left / right knee is facing forward. 3. Leading with your heel, lift your top leg 4-6 inches (10-15 cm). You should feel the muscles in your outer hip lifting. ? Do not let your foot drift forward. ? Do not let your knee roll toward the ceiling. 4. Hold this position for __________  seconds. 5. Slowly return to the starting position. 6. Let your muscles relax completely between repetitions. Repeat __________ times. Complete this exercise __________ times a day. Exercise G: Straight Leg Raises - Hip Adductors  1. Lie on your side with your left / right leg in the bottom position. Lie so your head, shoulder, knee, and hip line up. You may place your upper foot in front to help you balance. 2. Roll your hips slightly forward, so your hips are stacked directly over each other and your left / right knee is facing forward. 3. Tense the muscles in your inner thigh and lift your bottom leg 4-6 inches (10-15 cm). 4. Hold this position for __________ seconds. 5. Slowly return to the starting position. 6. Let your muscles relax completely between repetitions. Repeat __________ times. Complete this exercise __________ times a day. Exercise H: Straight Leg Raises - Quadriceps  1. Lie on your back with your left / right leg extended and   your other knee bent. 2. Tense the muscles in the front of your left / right thigh. When you do this, you should see your kneecap slide up or see increased dimpling just above your knee. 3. Tighten these muscles even more and raise your leg 4-6 inches (10-15 cm) off the floor. 4. Hold this position for __________ seconds. 5. Keep these muscles tense as you lower your leg. 6. Relax the muscles slowly and completely between repetitions. Repeat __________ times. Complete this exercise __________ times a day. Exercise I: Hip Abductors, Standing 1. Tie one end of a rubber exercise band or tubing to a secure surface, such as a table or pole. 2. Loop the other end of the band or tubing around your left / right ankle. 3. Keeping your ankle with the band or tubing directly opposite of the secured end, step away until there is tension in the tubing or band. Hold onto a chair as needed for balance. 4. Lift your left / right leg out to your side. While you do  this: ? Keep your back upright. ? Keep your shoulders over your hips. ? Keep your toes pointing forward. ? Make sure to use your hip muscles to lift your leg. Do not "throw" your leg or tip your body to lift your leg. 5. Hold this position for __________ seconds. 6. Slowly return to the starting position. Repeat __________ times. Complete this exercise __________ times a day. Exercise J: Squats (Quadriceps) 1. Stand in a door frame so your feet and knees are in line with the frame. You may place your hands on the frame for balance. 2. Slowly bend your knees and lower your hips like you are going to sit in a chair. ? Keep your lower legs in a straight-up-and-down position. ? Do not let your hips go lower than your knees. ? Do not bend your knees lower than told by your health care provider. ? If your hip pain increases, do not bend as low. 3. Hold this position for ___________ seconds. 4. Slowly push with your legs to return to standing. Do not use your hands to pull yourself to standing. Repeat __________ times. Complete this exercise __________ times a day. This information is not intended to replace advice given to you by your health care provider. Make sure you discuss any questions you have with your health care provider. Document Released: 12/13/2005 Document Revised: 08/19/2016 Document Reviewed: 11/20/2015 Elsevier Interactive Patient Education  [employer].

## 2018-05-12 LAB — LIPID PANEL
CHOLESTEROL TOTAL: 144 mg/dL (ref 100–199)
Chol/HDL Ratio: 3.3 ratio (ref 0.0–4.4)
HDL: 43 mg/dL (ref >39)
LDL Calculated: 81 mg/dL (ref 0–99)
Triglycerides: 102 mg/dL (ref 0–149)
VLDL CHOLESTEROL CAL: 20 mg/dL (ref 5–40)

## 2018-05-12 LAB — COMPREHENSIVE METABOLIC PANEL
ALBUMIN: 4.2 g/dL (ref 3.5–5.5)
ALK PHOS: 73 IU/L (ref 39–117)
ALT: 14 IU/L (ref 0–32)
AST: 16 IU/L (ref 0–40)
Albumin/Globulin Ratio: 1.6 (ref 1.2–2.2)
BILIRUBIN TOTAL: 0.2 mg/dL (ref 0.0–1.2)
BUN / CREAT RATIO: 13 (ref 9–23)
BUN: 11 mg/dL (ref 6–24)
CHLORIDE: 104 mmol/L (ref 96–106)
CO2: 21 mmol/L (ref 20–29)
CREATININE: 0.83 mg/dL (ref 0.57–1.00)
Calcium: 9.3 mg/dL (ref 8.7–10.2)
GFR calc non Af Amer: 78 mL/min/{1.73_m2} (ref >59)
GFR, EST AFRICAN AMERICAN: 90 mL/min/{1.73_m2} (ref >59)
GLUCOSE: 93 mg/dL (ref 65–99)
Globulin, Total: 2.6 g/dL (ref 1.5–4.5)
Potassium: 4.5 mmol/L (ref 3.5–5.2)
Sodium: 140 mmol/L (ref 134–144)
TOTAL PROTEIN: 6.8 g/dL (ref 6.0–8.5)

## 2018-05-12 LAB — CBC WITH DIFFERENTIAL/PLATELET
BASOS: 0 %
Basophils Absolute: 0 10*3/uL (ref 0.0–0.2)
EOS (ABSOLUTE): 0.1 10*3/uL (ref 0.0–0.4)
EOS: 2 %
HEMATOCRIT: 44.5 % (ref 34.0–46.6)
Hemoglobin: 14.7 g/dL (ref 11.1–15.9)
Immature Grans (Abs): 0 10*3/uL (ref 0.0–0.1)
Immature Granulocytes: 0 %
LYMPHS ABS: 2.1 10*3/uL (ref 0.7–3.1)
Lymphs: 40 %
MCH: 30.2 pg (ref 26.6–33.0)
MCHC: 33 g/dL (ref 31.5–35.7)
MCV: 92 fL (ref 79–97)
MONOS ABS: 0.3 10*3/uL (ref 0.1–0.9)
Monocytes: 7 %
Neutrophils Absolute: 2.7 10*3/uL (ref 1.4–7.0)
Neutrophils: 51 %
PLATELETS: 258 10*3/uL (ref 150–450)
RBC: 4.86 x10E6/uL (ref 3.77–5.28)
RDW: 14 % (ref 12.3–15.4)
WBC: 5.3 10*3/uL (ref 3.4–10.8)

## 2018-05-12 LAB — URINALYSIS, MICROSCOPIC ONLY: Bacteria, UA: NONE SEEN

## 2018-05-12 LAB — HEMOGLOBIN A1C
Est. average glucose Bld gHb Est-mCnc: 114 mg/dL
Hgb A1c MFr Bld: 5.6 % (ref 4.8–5.6)

## 2018-05-12 LAB — TSH: TSH: 2.77 u[IU]/mL (ref 0.450–4.500)

## 2018-05-12 LAB — T4, FREE: Free T4: 1.47 ng/dL (ref 0.82–1.77)

## 2018-05-14 ENCOUNTER — Other Ambulatory Visit: Payer: Self-pay | Admitting: Family Medicine

## 2018-05-14 DIAGNOSIS — Z1231 Encounter for screening mammogram for malignant neoplasm of breast: Secondary | ICD-10-CM

## 2018-05-28 ENCOUNTER — Other Ambulatory Visit: Payer: Self-pay | Admitting: Family Medicine

## 2018-06-09 ENCOUNTER — Ambulatory Visit
Admission: RE | Admit: 2018-06-09 | Discharge: 2018-06-09 | Disposition: A | Discharge status: Home / Self Care | Payer: Managed Care, Other (non HMO) | Source: Ambulatory Visit | Attending: Family Medicine | Admitting: Family Medicine

## 2018-06-09 DIAGNOSIS — Z1231 Encounter for screening mammogram for malignant neoplasm of breast: Secondary | ICD-10-CM | POA: Diagnosis not present

## 2018-06-28 ENCOUNTER — Other Ambulatory Visit: Payer: Self-pay | Admitting: Family Medicine

## 2018-06-28 DIAGNOSIS — F411 Generalized anxiety disorder: Secondary | ICD-10-CM

## 2018-09-23 ENCOUNTER — Other Ambulatory Visit: Payer: Self-pay | Admitting: Family Medicine

## 2019-04-27 ENCOUNTER — Other Ambulatory Visit: Payer: Self-pay | Admitting: Family Medicine

## 2019-04-27 DIAGNOSIS — Z1231 Encounter for screening mammogram for malignant neoplasm of breast: Secondary | ICD-10-CM

## 2019-08-09 ENCOUNTER — Ambulatory Visit
Admission: RE | Admit: 2019-08-09 | Discharge: 2019-08-09 | Disposition: A | Discharge status: Home / Self Care | Payer: Managed Care, Other (non HMO) | Source: Ambulatory Visit | Attending: Family Medicine | Admitting: Family Medicine

## 2019-08-09 DIAGNOSIS — Z1231 Encounter for screening mammogram for malignant neoplasm of breast: Secondary | ICD-10-CM | POA: Diagnosis present

## 2020-05-01 ENCOUNTER — Other Ambulatory Visit: Payer: Self-pay | Admitting: Family Medicine

## 2020-05-01 DIAGNOSIS — Z1231 Encounter for screening mammogram for malignant neoplasm of breast: Secondary | ICD-10-CM

## 2020-08-04 ENCOUNTER — Telehealth (INDEPENDENT_AMBULATORY_CARE_PROVIDER_SITE_OTHER): Payer: Self-pay | Admitting: Gastroenterology

## 2020-08-04 ENCOUNTER — Other Ambulatory Visit: Payer: Self-pay

## 2020-08-04 DIAGNOSIS — Z1211 Encounter for screening for malignant neoplasm of colon: Secondary | ICD-10-CM

## 2020-08-04 NOTE — Progress Notes (Signed)
Gastroenterology Pre-Procedure Review  Request Date: Friday 10/06/20 Requesting Physician: Dr. Vicente Males  PATIENT REVIEW QUESTIONS: The patient responded to the following health history questions as indicated:    1. Are you having any GI issues? no 2. Do you have a personal history of Polyps? no 3. Do you have a family history of Colon Cancer or Polyps? no 4. Diabetes Mellitus? no 5. Joint replacements in the past 12 months?no 6. Major health problems in the past 3 months?no 7. Any artificial heart valves, MVP, or defibrillator?no    MEDICATIONS & ALLERGIES:    Patient reports the following regarding taking any anticoagulation/antiplatelet therapy:   Plavix, Coumadin, Eliquis, Xarelto, Lovenox, Pradaxa, Brilinta, or Effient? no Aspirin? yes (81 MG DAILY)  Patient confirms/reports the following medications:  Current Outpatient Medications  Medication Sig Dispense Refill  . ALPRAZolam (XANAX) 0.5 MG tablet TAKE 1 TABLET BY MOUTH ONCE DAILY AS NEEDED FOR ANXIETY 30 tablet 5  . aspirin EC 81 MG tablet Take 1 tablet (81 mg total) by mouth daily. 1 tablet 0  . cyclobenzaprine (FLEXERIL) 10 MG tablet TAKE 1 TABLET BY MOUTH THREE TIMES DAILY AS NEEDED 90 tablet 1  . levothyroxine (SYNTHROID, LEVOTHROID) 100 MCG tablet Take one tablet by mouth daily on every day, except on Tuesdays and Thursdays take 1 1/2 tablets. 105 tablet 3  . lisinopril-hydrochlorothiazide (PRINZIDE,ZESTORETIC) 10-12.5 MG tablet Take 1 tablet by mouth daily. 90 tablet 3  . lovastatin (MEVACOR) 40 MG tablet Take 1 tablet (40 mg total) by mouth at bedtime. 90 tablet 3  . EUTHYROX 125 MCG tablet Take 125 mcg by mouth daily.    . varenicline (CHANTIX CONTINUING MONTH PAK) 1 MG tablet Take 1 tablet (1 mg total) by mouth 2 (two) times daily. (Patient not taking: Reported on 08/04/2020) 60 tablet 2  . varenicline (CHANTIX STARTING MONTH PAK) 0.5 MG X 11 & 1 MG X 42 tablet Take one 0.5 mg tablet by mouth once daily for 3 days, then  increase to one 0.5 mg tablet twice daily for 4 days, then increase to one 1 mg tablet twice daily. (Patient not taking: Reported on 08/04/2020) 53 tablet 0   No current facility-administered medications for this visit.    Patient confirms/reports the following allergies:  Allergies  Allergen Reactions  . Penicillins     Blacked out  . Pseudoephedrine     Heart races, seizures    No orders of the defined types were placed in this encounter.   AUTHORIZATION INFORMATION Primary Insurance: 1D#: Group #:  Secondary Insurance: 1D#: Group #:  SCHEDULE INFORMATION: Date: fRIDAY 10/06/20 Time: Location:[facility]

## 2020-09-25 ENCOUNTER — Telehealth: Payer: Self-pay

## 2020-09-25 NOTE — Telephone Encounter (Signed)
Patient's pharmacy called regarding the prep order that was sent in. I contacted the pharmacy to see if we could switch the prescription prep and staff member stated that would be ok.

## 2020-10-04 ENCOUNTER — Other Ambulatory Visit
Admission: RE | Admit: 2020-10-04 | Discharge: 2020-10-04 | Disposition: A | Discharge status: Home / Self Care | Payer: Managed Care, Other (non HMO) | Source: Ambulatory Visit | Attending: Gastroenterology | Admitting: Gastroenterology

## 2020-10-04 ENCOUNTER — Other Ambulatory Visit: Payer: Self-pay

## 2020-10-04 DIAGNOSIS — Z01812 Encounter for preprocedural laboratory examination: Secondary | ICD-10-CM | POA: Diagnosis present

## 2020-10-04 DIAGNOSIS — U071 COVID-19: Secondary | ICD-10-CM | POA: Diagnosis not present

## 2020-10-05 ENCOUNTER — Encounter: Payer: Self-pay | Admitting: Gastroenterology

## 2020-10-05 ENCOUNTER — Telehealth: Payer: Self-pay | Admitting: Adult Health

## 2020-10-05 LAB — SARS CORONAVIRUS 2 (TAT 6-24 HRS): SARS Coronavirus 2: POSITIVE — AB

## 2020-10-05 NOTE — Telephone Encounter (Signed)
Called patient about covid positivity to screen for potential monoclonal antibody treatment.  She is asymptomatic, and notes she had COVID in September.  Treatment not indicated.  Wilber Bihari, NP

## 2020-10-06 ENCOUNTER — Encounter
Admission: RE | Disposition: A | Discharge status: Home / Self Care | Payer: Self-pay | Source: Home / Self Care | Attending: Gastroenterology

## 2020-10-06 ENCOUNTER — Ambulatory Visit
Admission: RE | Admit: 2020-10-06 | Discharge: 2020-10-06 | Disposition: A | Discharge status: Home / Self Care | Payer: Managed Care, Other (non HMO) | Attending: Gastroenterology | Admitting: Gastroenterology

## 2020-10-06 ENCOUNTER — Ambulatory Visit: Payer: Managed Care, Other (non HMO) | Admitting: Certified Registered Nurse Anesthetist

## 2020-10-06 DIAGNOSIS — F17210 Nicotine dependence, cigarettes, uncomplicated: Secondary | ICD-10-CM | POA: Insufficient documentation

## 2020-10-06 DIAGNOSIS — Z6841 Body Mass Index (BMI) 40.0 and over, adult: Secondary | ICD-10-CM | POA: Diagnosis not present

## 2020-10-06 DIAGNOSIS — Z880 Allergy status to penicillin: Secondary | ICD-10-CM | POA: Diagnosis not present

## 2020-10-06 DIAGNOSIS — Z811 Family history of alcohol abuse and dependence: Secondary | ICD-10-CM | POA: Diagnosis not present

## 2020-10-06 DIAGNOSIS — Z823 Family history of stroke: Secondary | ICD-10-CM | POA: Diagnosis not present

## 2020-10-06 DIAGNOSIS — E669 Obesity, unspecified: Secondary | ICD-10-CM | POA: Diagnosis not present

## 2020-10-06 DIAGNOSIS — M1990 Unspecified osteoarthritis, unspecified site: Secondary | ICD-10-CM | POA: Insufficient documentation

## 2020-10-06 DIAGNOSIS — Z833 Family history of diabetes mellitus: Secondary | ICD-10-CM | POA: Diagnosis not present

## 2020-10-06 DIAGNOSIS — E785 Hyperlipidemia, unspecified: Secondary | ICD-10-CM | POA: Insufficient documentation

## 2020-10-06 DIAGNOSIS — Z825 Family history of asthma and other chronic lower respiratory diseases: Secondary | ICD-10-CM | POA: Diagnosis not present

## 2020-10-06 DIAGNOSIS — Z86010 Personal history of colonic polyps: Secondary | ICD-10-CM | POA: Diagnosis not present

## 2020-10-06 DIAGNOSIS — Z8379 Family history of other diseases of the digestive system: Secondary | ICD-10-CM | POA: Diagnosis not present

## 2020-10-06 DIAGNOSIS — K635 Polyp of colon: Secondary | ICD-10-CM | POA: Diagnosis not present

## 2020-10-06 DIAGNOSIS — Z8249 Family history of ischemic heart disease and other diseases of the circulatory system: Secondary | ICD-10-CM | POA: Insufficient documentation

## 2020-10-06 DIAGNOSIS — Z888 Allergy status to other drugs, medicaments and biological substances status: Secondary | ICD-10-CM | POA: Diagnosis not present

## 2020-10-06 DIAGNOSIS — I10 Essential (primary) hypertension: Secondary | ICD-10-CM | POA: Diagnosis not present

## 2020-10-06 DIAGNOSIS — Z7982 Long term (current) use of aspirin: Secondary | ICD-10-CM | POA: Diagnosis not present

## 2020-10-06 DIAGNOSIS — Z820 Family history of epilepsy and other diseases of the nervous system: Secondary | ICD-10-CM | POA: Insufficient documentation

## 2020-10-06 DIAGNOSIS — Z1211 Encounter for screening for malignant neoplasm of colon: Secondary | ICD-10-CM | POA: Diagnosis not present

## 2020-10-06 DIAGNOSIS — D122 Benign neoplasm of ascending colon: Secondary | ICD-10-CM | POA: Diagnosis not present

## 2020-10-06 DIAGNOSIS — Z79899 Other long term (current) drug therapy: Secondary | ICD-10-CM | POA: Diagnosis not present

## 2020-10-06 DIAGNOSIS — Z803 Family history of malignant neoplasm of breast: Secondary | ICD-10-CM | POA: Insufficient documentation

## 2020-10-06 DIAGNOSIS — K621 Rectal polyp: Secondary | ICD-10-CM | POA: Insufficient documentation

## 2020-10-06 DIAGNOSIS — Z90710 Acquired absence of both cervix and uterus: Secondary | ICD-10-CM | POA: Insufficient documentation

## 2020-10-06 HISTORY — PX: COLONOSCOPY WITH PROPOFOL: SHX5780

## 2020-10-06 SURGERY — COLONOSCOPY WITH PROPOFOL
Anesthesia: General

## 2020-10-06 MED ORDER — PROPOFOL 10 MG/ML IV BOLUS
INTRAVENOUS | Status: DC | PRN
  Administered 2020-10-06: 80 mg via INTRAVENOUS

## 2020-10-06 MED ORDER — SODIUM CHLORIDE 0.9 % IV SOLN: INTRAVENOUS | Status: DC

## 2020-10-06 MED ORDER — PROPOFOL 500 MG/50ML IV EMUL
INTRAVENOUS | Status: DC | PRN
  Administered 2020-10-06: 140 ug/kg/min via INTRAVENOUS

## 2020-10-06 NOTE — Transfer of Care (Signed)
Immediate Anesthesia Transfer of Care Note  Patient: Beth Hughes  Procedure(s) Performed: COLONOSCOPY WITH PROPOFOL (N/A )  Patient Location: PACU  Anesthesia Type:General  Level of Consciousness: sedated  Airway & Oxygen Therapy: Patient Spontanous Breathing  Post-op Assessment: Report given to RN and Post -op Vital signs reviewed and stable  Post vital signs: Reviewed and stable  Last Vitals:  Vitals Value Taken Time  BP    Temp    Pulse    Resp    SpO2      Last Pain:  Vitals:   10/06/20 0900  TempSrc: Temporal  PainSc: 0-No pain         Complications: No complications documented.

## 2020-10-06 NOTE — Anesthesia Preprocedure Evaluation (Signed)
Anesthesia Evaluation  Patient identified by MRN, date of birth, ID band Patient awake    Reviewed: Allergy & Precautions, NPO status , Patient's Chart, lab work & pertinent test results  History of Anesthesia Complications Negative for: history of anesthetic complications  Airway Mallampati: II  TM Distance: >3 FB Neck ROM: Full    Dental  (+) Upper Dentures, Lower Dentures   Pulmonary neg sleep apnea, neg COPD, Current SmokerPatient did not abstain from smoking.,    breath sounds clear to auscultation- rhonchi (-) wheezing      Cardiovascular hypertension, Pt. on medications (-) CAD, (-) Past MI, (-) Cardiac Stents and (-) CABG  Rhythm:Regular Rate:Normal - Systolic murmurs and - Diastolic murmurs    Neuro/Psych neg Seizures Anxiety negative neurological ROS     GI/Hepatic negative GI ROS, Neg liver ROS,   Endo/Other  neg diabetesHypothyroidism   Renal/GU negative Renal ROS     Musculoskeletal  (+) Arthritis ,   Abdominal (+) + obese,   Peds  Hematology negative hematology ROS (+)   Anesthesia Other Findings Past Medical History: No date: Abnormal LFTs (liver function tests) No date: Anxiety No date: Hypertension No date: Insomnia, unspecified No date: Other and unspecified hyperlipidemia 10/21/2014: Snoring No date: Tobacco use disorder No date: Unspecified hypothyroidism   Reproductive/Obstetrics                             Anesthesia Physical Anesthesia Plan  ASA: II  Anesthesia Plan: General   Post-op Pain Management:    Induction: Intravenous  PONV Risk Score and Plan: 1 and Propofol infusion  Airway Management Planned: Natural Airway  Additional Equipment:   Intra-op Plan:   Post-operative Plan:   Informed Consent: I have reviewed the patients History and Physical, chart, labs and discussed the procedure including the risks, benefits and alternatives for the  proposed anesthesia with the patient or authorized representative who has indicated his/her understanding and acceptance.     Dental advisory given  Plan Discussed with: CRNA and Anesthesiologist  Anesthesia Plan Comments:         Anesthesia Quick Evaluation

## 2020-10-06 NOTE — H&P (Signed)
Jonathon Bellows, MD [address] [place], [place], [place], [address] [address], [place], [place], [place], [address] Phone: [phone]  Fax: [phone]  Primary Care Physician:  [name], MD   Pre-Procedure History & Physical: HPI:  Beth Hughes is a 59 y.o. female is here for an colonoscopy.   Past Medical History:  Diagnosis Date  . Abnormal LFTs (liver function tests)   . Anxiety   . Hypertension   . Insomnia, unspecified   . Other and unspecified hyperlipidemia   . Snoring 10/21/2014  . Tobacco use disorder   . Unspecified hypothyroidism     Past Surgical History:  Procedure Laterality Date  . ABDOMINAL HYSTERECTOMY  12/09/1998   ovarian cysts; B ooophorectomy.  No  cervical dysplasia.  [place].  . COLONOSCOPY W/ POLYPECTOMY  12/09/2008   colon polyps. [facility].  Repeat in 5 years.    Prior to Admission medications   Medication Sig Start Date End Date Taking? Authorizing Provider  ALPRAZolam Duanne Moron) 0.5 MG tablet TAKE 1 TABLET BY MOUTH ONCE DAILY AS NEEDED FOR ANXIETY 06/30/18   [name], MD  aspirin EC 81 MG tablet Take 1 tablet (81 mg total) by mouth daily. 10/20/15   [name], MD  cyclobenzaprine (FLEXERIL) 10 MG tablet TAKE 1 TABLET BY MOUTH THREE TIMES DAILY AS NEEDED 05/29/18   [name], MD  EUTHYROX 125 MCG tablet Take 125 mcg by mouth daily. 07/26/20   [provider]  levothyroxine (SYNTHROID, LEVOTHROID) 100 MCG tablet Take one tablet by mouth daily on every day, except on Tuesdays and Thursdays take 1 1/2 tablets. 11/10/17   [name], MD  lisinopril-hydrochlorothiazide (PRINZIDE,ZESTORETIC) 10-12.5 MG tablet Take 1 tablet by mouth daily. 11/10/17   [name], MD  lovastatin (MEVACOR) 40 MG tablet Take 1 tablet (40 mg total) by mouth at bedtime. 11/10/17   [name], MD  varenicline (CHANTIX CONTINUING MONTH PAK) 1 MG tablet Take 1 tablet (1 mg total) by mouth 2 (two) times daily. Patient not  taking: Reported on 08/04/2020 11/10/17   [name], MD  varenicline (CHANTIX STARTING MONTH PAK) 0.5 MG X 11 & 1 MG X 42 tablet Take one 0.5 mg tablet by mouth once daily for 3 days, then increase to one 0.5 mg tablet twice daily for 4 days, then increase to one 1 mg tablet twice daily. Patient not taking: Reported on 08/04/2020 11/10/17   [name], MD    Allergies as of 08/04/2020 - Review Complete 08/04/2020  Allergen Reaction Noted  . Penicillins  09/22/2012  . Pseudoephedrine  09/22/2012    Family History  Problem Relation Age of Onset  . Congestive Heart Failure Mother   . Diabetes Mother   . COPD Mother   . Atrial fibrillation Mother   . Macular degeneration Mother   . Heart disease Mother 42       CABG  . Stroke Mother 104       brain aneurysm in 28  . Aneurysm Mother   . Breast cancer Sister 32       genetic testing negative  . Heart disease Sister        atrial fibrillation  . Cancer Sister 78       breast cancer  . Alcohol abuse Brother   . COPD Brother   . Congestive Heart Failure Brother   . Diabetes Brother   . Heart disease Brother  atrial fibrllation  . Hepatitis C Brother   . COPD Brother   . Atrial fibrillation Brother     Social History   Socioeconomic History  . Marital status: Widowed    Spouse name: Not on file  . Number of children: 1  . Years of education: Not on file  . Highest education level: Not on file  Occupational History  . Occupation: Arts administrator  Tobacco Use  . Smoking status: Current Every Day Smoker    Packs/day: 1.00    Years: 40.00    Pack years: 40.00  . Smokeless tobacco: Never Used  Substance and Sexual Activity  . Alcohol use: Yes    Alcohol/week: 0.0 standard drinks    Comment: occasionally mixed drink  . Drug use: No  . Sexual activity: Not Currently    Comment: Last sexual activity 2004.  Other Topics Concern  . Not on file  Social History Narrative   Marital status: single; not dating.   Not interested.      Children; one son (1); no grandchildren      Lives: with son.        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      Tobacco: 1 ppd x 40 years.   Quit for one month.      Alcohol: none; rarely; once per month.      Drugs: none      Exercise: none; walking 6500 steps per day in 2017.      Seatbelt:  100%      Guns: none      Sexual activity:  Not sexual activity:  Last sexual activity over ten years ago; no STDs.  Last STD testing five years ago.   Social Determinants of Health   Financial Resource Strain:   . Difficulty of Paying Living Expenses: Not on file  Food Insecurity:   . Worried About Charity fundraiser in the Last Year: Not on file  . Ran Out of Food in the Last Year: Not on file  Transportation Needs:   . Lack of Transportation (Medical): Not on file  . Lack of Transportation (Non-Medical): Not on file  Physical Activity:   . Days of Exercise per Week: Not on file  . Minutes of Exercise per Session: Not on file  Stress:   . Feeling of Stress : Not on file  Social Connections:   . Frequency of Communication with Friends and Family: Not on file  . Frequency of Social Gatherings with Friends and Family: Not on file  . Attends Religious Services: Not on file  . Active Member of Clubs or Organizations: Not on file  . Attends Archivist Meetings: Not on file  . Marital Status: Not on file  Intimate Partner Violence:   . Fear of Current or Ex-Partner: Not on file  . Emotionally Abused: Not on file  . Physically Abused: Not on file  . Sexually Abused: Not on file    Review of Systems: See HPI, otherwise negative ROS  Physical Exam: BP (!) 168/86   Pulse 98   Temp 97.8 F (36.6 C) (Temporal)   Resp 17   Ht 5\' 4"  (1.626 m)   Wt 112.5 kg   SpO2 97%   BMI 42.57 kg/m  General:   Alert,  pleasant and cooperative in NAD Head:  Normocephalic and atraumatic. Neck:  Supple; no masses or thyromegaly. Lungs:   Clear throughout to auscultation, normal respiratory  effort.    Heart:  +S1, +S2, Regular rate and rhythm, No edema. Abdomen:  Soft, nontender and nondistended. Normal bowel sounds, without guarding, and without rebound.   Neurologic:  Alert and  oriented x4;  grossly normal neurologically.  Impression/Plan: SHARDAI STAR is here for an colonoscopy to be performed for Screening colonoscopy average risk   Risks, benefits, limitations, and alternatives regarding  colonoscopy have been reviewed with the patient.  Questions have been answered.  All parties agreeable.   Jonathon Bellows, MD  10/06/2020, 9:49 AM

## 2020-10-06 NOTE — Anesthesia Postprocedure Evaluation (Signed)
Anesthesia Post Note  Patient: Beth Hughes  Procedure(s) Performed: COLONOSCOPY WITH PROPOFOL (N/A )  Patient location during evaluation: Endoscopy Anesthesia Type: General Level of consciousness: awake and alert and oriented Pain management: pain level controlled Vital Signs Assessment: post-procedure vital signs reviewed and stable Respiratory status: spontaneous breathing, nonlabored ventilation and respiratory function stable Cardiovascular status: blood pressure returned to baseline and stable Postop Assessment: no signs of nausea or vomiting Anesthetic complications: no   No complications documented.   Last Vitals:  Vitals:   10/06/20 1050 10/06/20 1058  BP:  133/71  Pulse: 81 80  Resp: (!) 22 19  Temp:    SpO2: 98% 100%    Last Pain:  Vitals:   10/06/20 0900  TempSrc: Temporal  PainSc: 0-No pain                 Desman Polak

## 2020-10-06 NOTE — Op Note (Signed)
[facility]astroenterology Patient Name: Beth Hughes Procedure Date: 10/06/2020 9:50 AM MRN: [id number] Account #: [id number] Date of Birth: June 20, 1961 Admit Type: Outpatient Age: 59 Room: [facility] ENDO ROOM 4 Gender: Female Note Status: Finalized Procedure:             Colonoscopy Indications:           Screening for colorectal malignant neoplasm Providers:             Jonathon Bellows MD, MD Referring MD:          Renette Butters. Tamala Julian, MD (Referring MD) Medicines:             Monitored Anesthesia Care Complications:         No immediate complications. Procedure:             Pre-Anesthesia Assessment:                        - Prior to the procedure, a History and Physical was                         performed, and patient medications, allergies and                         sensitivities were reviewed. The patient's tolerance                         of previous anesthesia was reviewed.                        - The risks and benefits of the procedure and the                         sedation options and risks were discussed with the                         patient. All questions were answered and informed                         consent was obtained.                        - ASA Grade Assessment: II - A patient with mild                         systemic disease.                        After obtaining informed consent, the colonoscope was                         passed under direct vision. Throughout the procedure,                         the patient's blood pressure, pulse, and oxygen                         saturations were monitored continuously. The                         Colonoscope was introduced through the anus  and                         advanced to the the cecum, identified by the                         appendiceal orifice. The colonoscopy was performed                         with ease. The patient tolerated the procedure well.                         The quality  of the bowel preparation was good. Findings:      The perianal and digital rectal examinations were normal.      Two sessile polyps were found in the ascending colon. The polyps were 8       to 10 mm in size. These polyps were removed with a cold snare. Resection       and retrieval were complete.      A 10 mm polyp was found in the distal rectum. The polyp was       semi-pedunculated. The polyp was removed with a cold snare. Resection       and retrieval were complete. To prevent bleeding after the polypectomy,       one hemostatic clip was successfully placed. There was no bleeding at       the end of the procedure.      The exam was otherwise without abnormality on direct and retroflexion       views. Impression:            - Two 8 to 10 mm polyps in the ascending colon,                         removed with a cold snare. Resected and retrieved.                        - One 10 mm polyp in the distal rectum, removed with a                         cold snare. Resected and retrieved. Clip was placed.                        - The examination was otherwise normal on direct and                         retroflexion views. Recommendation:        - Discharge patient to home (with escort).                        - Resume previous diet.                        - Continue present medications.                        - Await pathology results.                        - Repeat colonoscopy for surveillance based on  pathology results. Procedure Code(s):     --- Professional ---                        [phone], Colonoscopy, flexible; with removal of                         tumor(s), polyp(s), or other lesion(s) by snare                         technique Diagnosis Code(s):     --- Professional ---                        Z12.11, Encounter for screening for malignant neoplasm                         of colon                        K63.5, Polyp of colon                        K62.1,  Rectal polyp CPT copyright 2019 [employer]. All rights reserved. The codes documented in this report are preliminary and upon coder review may  be revised to meet current compliance requirements. Jonathon Bellows, MD Jonathon Bellows MD, MD 10/06/2020 10:25:33 AM This report has been signed electronically. Number of Addenda: 0 Note Initiated On: 10/06/2020 9:50 AM Scope Withdrawal Time: 0 hours 14 minutes 26 seconds  Total Procedure Duration: 0 hours 24 minutes 2 seconds  Estimated Blood Loss:  Estimated blood loss: none.      [facility]

## 2020-10-09 ENCOUNTER — Encounter: Payer: Self-pay | Admitting: Gastroenterology

## 2020-10-09 LAB — SURGICAL PATHOLOGY

## 2020-10-10 ENCOUNTER — Encounter: Payer: Self-pay | Admitting: Gastroenterology

## 2020-10-31 ENCOUNTER — Other Ambulatory Visit: Payer: Self-pay

## 2020-10-31 ENCOUNTER — Ambulatory Visit
Admission: RE | Admit: 2020-10-31 | Discharge: 2020-10-31 | Disposition: A | Discharge status: Home / Self Care | Payer: Managed Care, Other (non HMO) | Source: Ambulatory Visit | Attending: Family Medicine | Admitting: Family Medicine

## 2020-10-31 DIAGNOSIS — Z1231 Encounter for screening mammogram for malignant neoplasm of breast: Secondary | ICD-10-CM

## 2021-06-02 IMAGING — MG DIGITAL SCREENING BILAT W/ TOMO W/ CAD
6 of 10 series · 6 of 30 positions shown · non-contrast
Comparison: Previous exam(s).

ACR Breast Density Category a: The breast tissue is almost entirely
fatty.

CLINICAL DATA: Screening.

EXAM:
DIGITAL SCREENING BILATERAL MAMMOGRAM WITH TOMO AND CAD

[L MLO synth-2D]
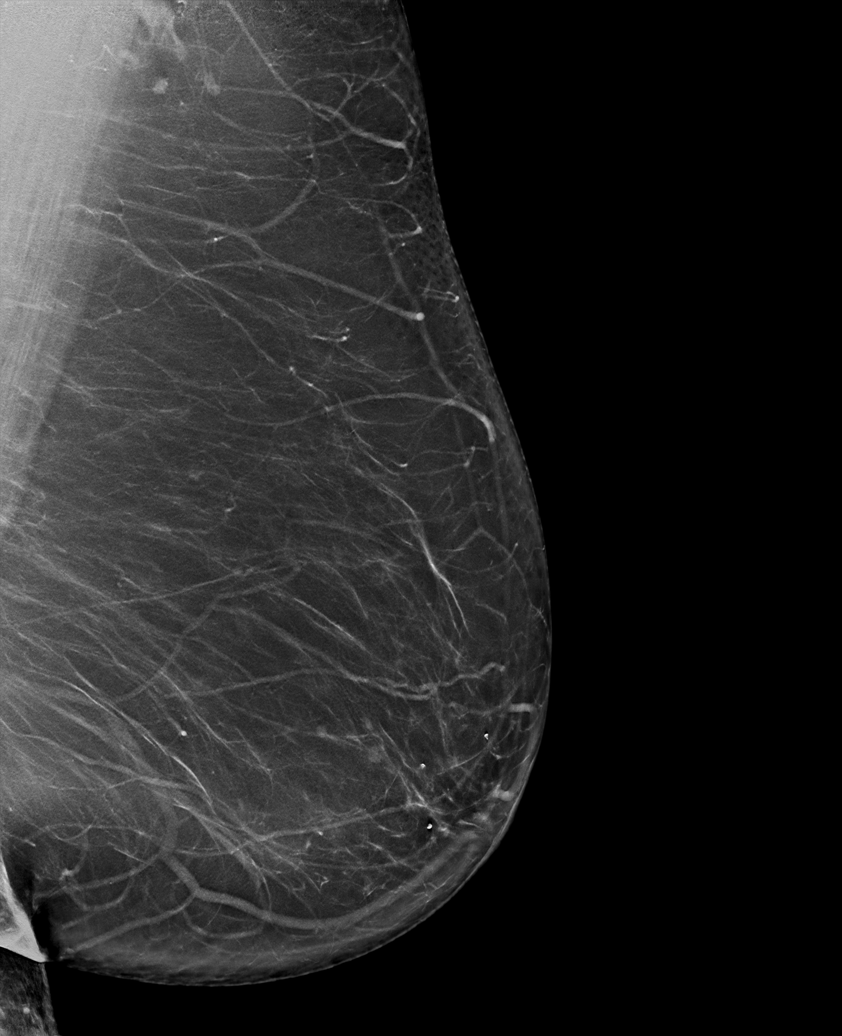

[R MLO synth-2D]
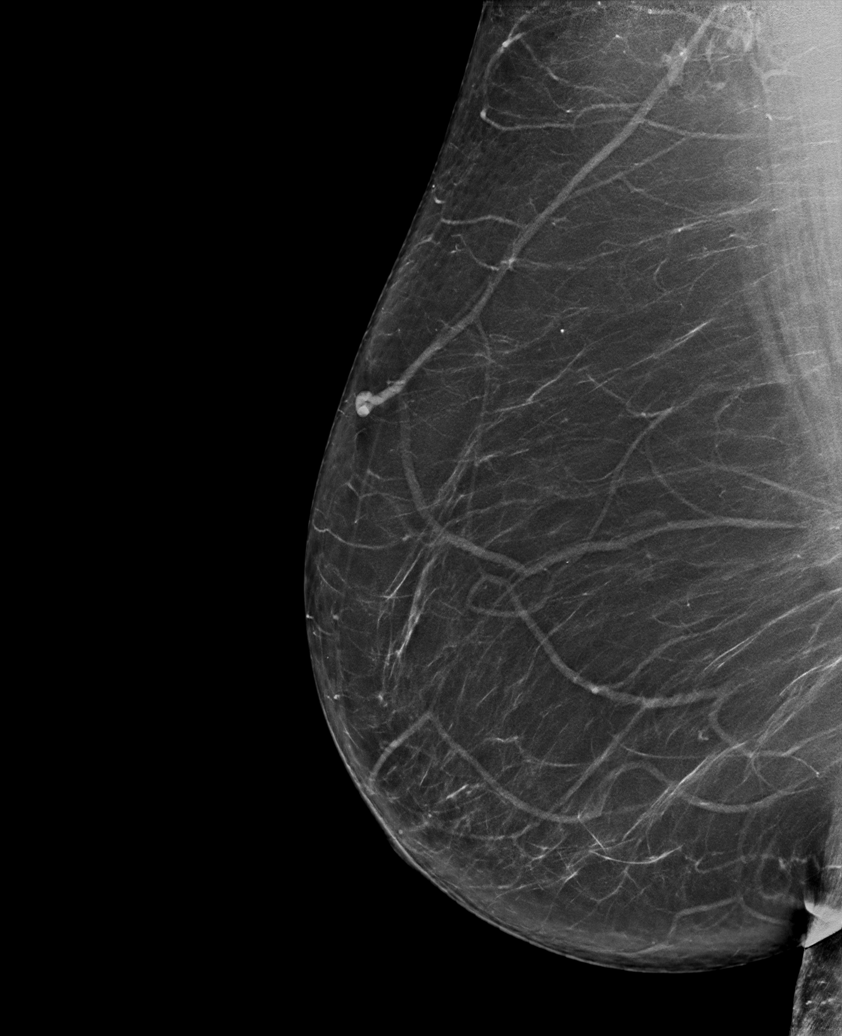

[L CV synth-2D]
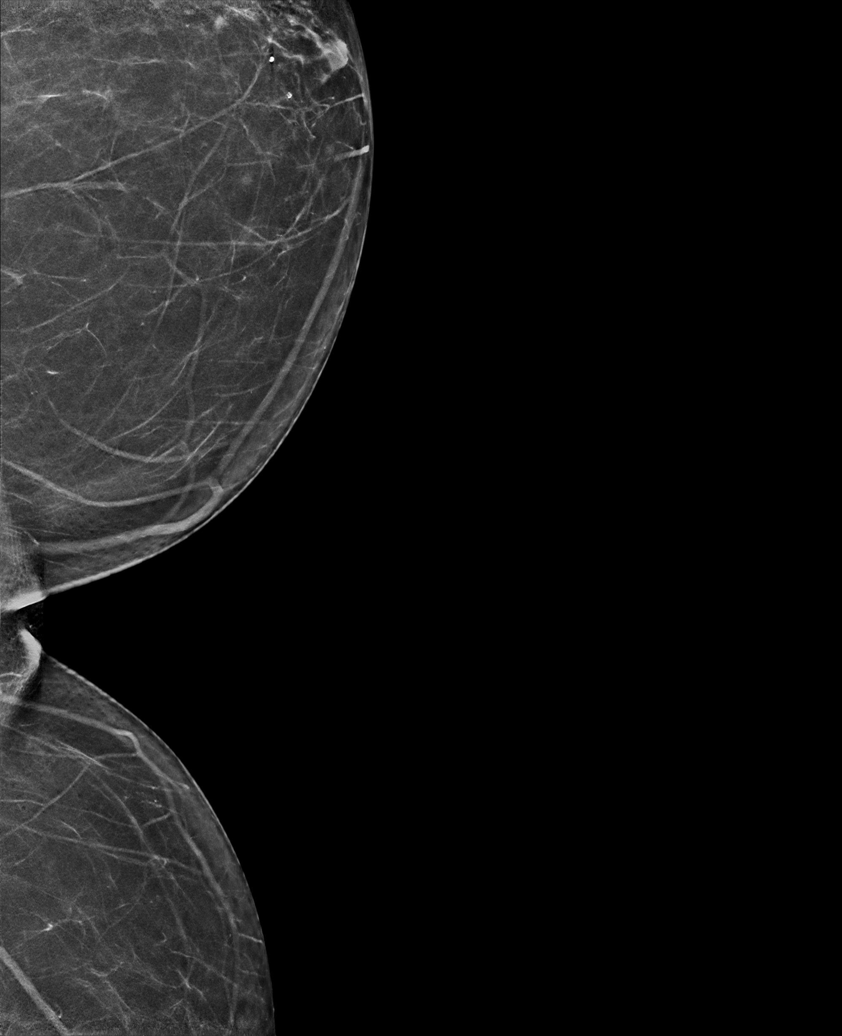

[R CC synth-2D]
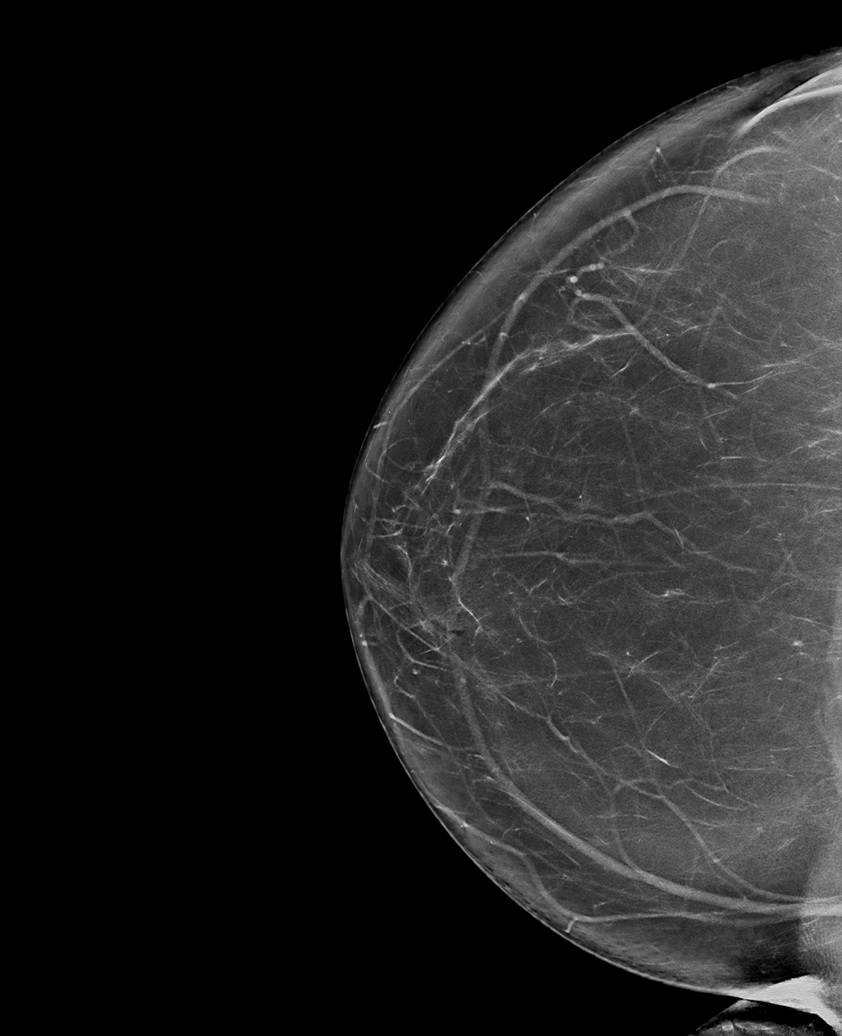

[L CC synth-2D]
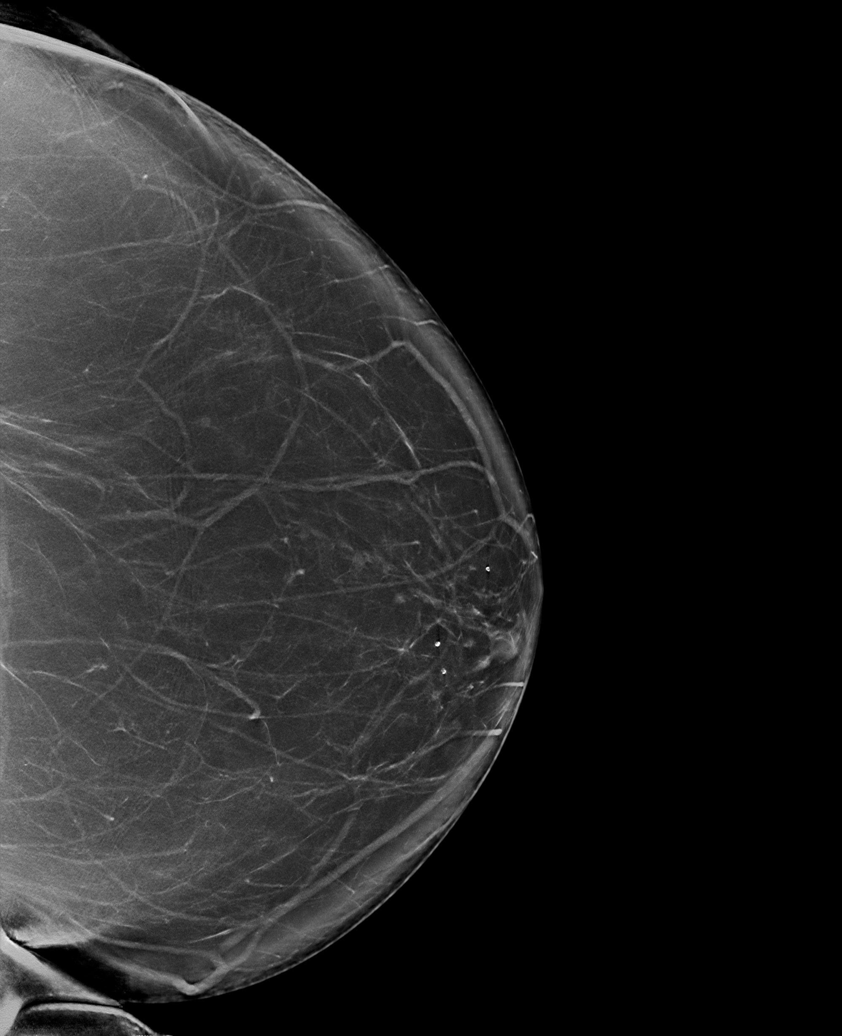

[R MLO tomo · tomo slice 43/85.0]
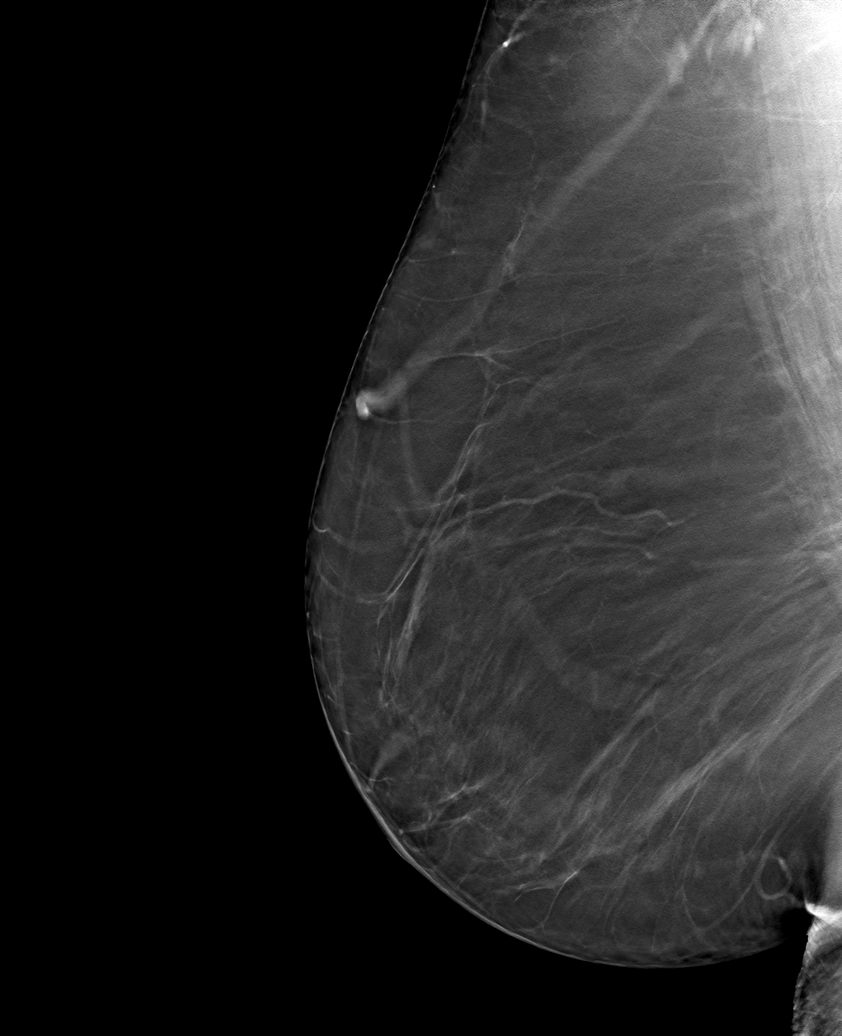

[6 of 30 positions shown; findings below may reference images not displayed]

FINDINGS: There are no findings suspicious for malignancy. Images were
processed with CAD.
IMPRESSION: No mammographic evidence of malignancy. A result letter of this
screening mammogram will be mailed directly to the patient.

RECOMMENDATION:
Screening mammogram in one year. (Code:[id number])

BI-RADS CATEGORY  1: Negative.

## 2021-12-07 ENCOUNTER — Other Ambulatory Visit: Payer: Self-pay | Admitting: Family Medicine

## 2021-12-07 DIAGNOSIS — Z1231 Encounter for screening mammogram for malignant neoplasm of breast: Secondary | ICD-10-CM

## 2021-12-14 ENCOUNTER — Ambulatory Visit
Admission: RE | Admit: 2021-12-14 | Discharge: 2021-12-14 | Disposition: A | Discharge status: Home / Self Care | Payer: Managed Care, Other (non HMO) | Source: Ambulatory Visit | Attending: Family Medicine | Admitting: Family Medicine

## 2021-12-14 ENCOUNTER — Other Ambulatory Visit: Payer: Self-pay

## 2021-12-14 DIAGNOSIS — Z1231 Encounter for screening mammogram for malignant neoplasm of breast: Secondary | ICD-10-CM | POA: Insufficient documentation

## 2022-12-27 ENCOUNTER — Other Ambulatory Visit: Payer: Self-pay | Admitting: Family Medicine

## 2022-12-27 DIAGNOSIS — Z1231 Encounter for screening mammogram for malignant neoplasm of breast: Secondary | ICD-10-CM

## 2023-01-17 ENCOUNTER — Ambulatory Visit
Admission: RE | Admit: 2023-01-17 | Discharge: 2023-01-17 | Disposition: A | Discharge status: Home / Self Care | Payer: Managed Care, Other (non HMO) | Source: Ambulatory Visit | Attending: Family Medicine | Admitting: Family Medicine

## 2023-01-17 DIAGNOSIS — Z1231 Encounter for screening mammogram for malignant neoplasm of breast: Secondary | ICD-10-CM

## 2023-12-23 ENCOUNTER — Other Ambulatory Visit: Payer: Self-pay | Admitting: Family Medicine

## 2023-12-23 DIAGNOSIS — Z1231 Encounter for screening mammogram for malignant neoplasm of breast: Secondary | ICD-10-CM

## 2024-01-30 ENCOUNTER — Ambulatory Visit
Admission: RE | Admit: 2024-01-30 | Discharge: 2024-01-30 | Disposition: A | Discharge status: Home / Self Care | Payer: Managed Care, Other (non HMO) | Source: Ambulatory Visit | Attending: Family Medicine | Admitting: Family Medicine

## 2024-01-30 DIAGNOSIS — Z1231 Encounter for screening mammogram for malignant neoplasm of breast: Secondary | ICD-10-CM | POA: Insufficient documentation

## 2024-10-29 ENCOUNTER — Other Ambulatory Visit: Payer: Self-pay | Admitting: Family Medicine

## 2024-10-29 DIAGNOSIS — J984 Other disorders of lung: Secondary | ICD-10-CM

## 2024-10-29 DIAGNOSIS — J22 Unspecified acute lower respiratory infection: Secondary | ICD-10-CM

## 2024-11-03 ENCOUNTER — Ambulatory Visit

## 2024-11-17 ENCOUNTER — Ambulatory Visit

## 2024-12-08 ENCOUNTER — Ambulatory Visit

## 2025-01-27 ENCOUNTER — Ambulatory Visit
